# Patient Record
Sex: Male | Born: 1980 | Race: Black or African American | Hispanic: No | Marital: Single | Smoking: Never smoker
Health system: Southern US, Community
[De-identification: ages and names within clinical notes are randomized; demographics above are authoritative.]

## PROBLEM LIST (undated history)

## (undated) DIAGNOSIS — J45909 Unspecified asthma, uncomplicated: Secondary | ICD-10-CM

## (undated) HISTORY — PX: MANDIBLE SURGERY: SHX707

---

## 2001-03-18 ENCOUNTER — Emergency Department (HOSPITAL_COMMUNITY): Admission: EM | Admit: 2001-03-18 | Discharge: 2001-03-18 | Payer: Self-pay | Admitting: *Deleted

## 2006-03-08 ENCOUNTER — Inpatient Hospital Stay (HOSPITAL_COMMUNITY): Admission: AD | Admit: 2006-03-08 | Discharge: 2006-03-12 | Payer: Self-pay | Admitting: Internal Medicine

## 2007-02-01 ENCOUNTER — Emergency Department (HOSPITAL_COMMUNITY): Admission: EM | Admit: 2007-02-01 | Discharge: 2007-02-02 | Payer: Self-pay | Admitting: Emergency Medicine

## 2007-09-30 ENCOUNTER — Emergency Department (HOSPITAL_COMMUNITY): Admission: EM | Admit: 2007-09-30 | Discharge: 2007-09-30 | Payer: Self-pay | Admitting: Emergency Medicine

## 2008-08-08 ENCOUNTER — Emergency Department (HOSPITAL_COMMUNITY): Admission: EM | Admit: 2008-08-08 | Discharge: 2008-08-09 | Payer: Self-pay | Admitting: Emergency Medicine

## 2008-08-13 ENCOUNTER — Emergency Department (HOSPITAL_COMMUNITY): Admission: EM | Admit: 2008-08-13 | Discharge: 2008-08-13 | Payer: Self-pay | Admitting: Emergency Medicine

## 2008-08-29 ENCOUNTER — Emergency Department (HOSPITAL_COMMUNITY): Admission: EM | Admit: 2008-08-29 | Discharge: 2008-08-29 | Payer: Self-pay | Admitting: Emergency Medicine

## 2008-11-29 ENCOUNTER — Emergency Department (HOSPITAL_COMMUNITY): Admission: EM | Admit: 2008-11-29 | Discharge: 2008-11-29 | Payer: Self-pay | Admitting: Emergency Medicine

## 2008-12-04 ENCOUNTER — Emergency Department (HOSPITAL_COMMUNITY): Admission: EM | Admit: 2008-12-04 | Discharge: 2008-12-04 | Payer: Self-pay | Admitting: Emergency Medicine

## 2010-04-29 ENCOUNTER — Inpatient Hospital Stay (INDEPENDENT_AMBULATORY_CARE_PROVIDER_SITE_OTHER)
Admission: RE | Admit: 2010-04-29 | Discharge: 2010-04-29 | Disposition: A | Discharge status: Home / Self Care | Payer: Self-pay | Source: Ambulatory Visit | Attending: Family Medicine | Admitting: Family Medicine

## 2010-04-29 DIAGNOSIS — L03019 Cellulitis of unspecified finger: Secondary | ICD-10-CM

## 2010-05-17 LAB — POCT I-STAT, CHEM 8
Calcium, Ion: 1.02 mmol/L — ABNORMAL LOW (ref 1.12–1.32)
Chloride: 106 mEq/L (ref 96–112)
Glucose, Bld: 133 mg/dL — ABNORMAL HIGH (ref 70–99)
HCT: 51 % (ref 39.0–52.0)
Potassium: 3.4 mEq/L — ABNORMAL LOW (ref 3.5–5.1)
Sodium: 141 mEq/L (ref 135–145)
TCO2: 22 mmol/L (ref 0–100)

## 2010-05-17 LAB — CBC
Hemoglobin: 16 g/dL (ref 13.0–17.0)
RBC: 5.63 MIL/uL (ref 4.22–5.81)
RDW: 13.9 % (ref 11.5–15.5)

## 2010-05-17 LAB — ETHANOL: Alcohol, Ethyl (B): 240 mg/dL — ABNORMAL HIGH (ref 0–10)

## 2010-05-17 LAB — LACTIC ACID, PLASMA: Lactic Acid, Venous: 2.4 mmol/L — ABNORMAL HIGH (ref 0.5–2.2)

## 2010-06-26 NOTE — H&P (Signed)
NAME:  Jared Chung, Jared Chung NO.:  [id number]   MEDICAL RECORD NO.:  [id number]          PATIENT TYPE:  EMS   LOCATION:  ED                           FACILITY:  [facility]   PHYSICIAN:  Hind I Elsaid, MD      DATE OF BIRTH:  04/10/1980   DATE OF ADMISSION:  03/08/2006  DATE OF DISCHARGE:                              HISTORY & PHYSICAL   CHIEF COMPLAINT:  Sore throat for one week and breaking of the skin for  three days.   HISTORY OF PRESENT ILLNESS:  This is a 30 year old African American male  with no previous medical history.  He presented to [facility] Emergency  Room with history starting since this Wednesday when the patient felt  sore throat and generalized body weakness associated with low grade  fever.  The patient had to see urgent care on Saturday for above  symptoms where he was found to have a fever of 101.1 and also according  to him a throat culture was done and he was diagnosed with streptococcal  tonsillitis and was discharged home with Penicillin V orally, Naproxen  and Vicodin.  The patient went home and for a day he felt better, then  he felt his sore throat was not improving and he has started to have  breaking on his mucous membranes, mainly around lips and small ulcer  around the tongue.  He developed epigastric abdominal pain like mainly  heart burn, epigastric in nature, associated with two to three times  spitting of phlegm associated with blood.  The condition associated with  skin rash but there is no breaking of the skin.  Condition associated  with fever and associated with chills and muscular pain and arthralgia.  Denies any chest pain.  Denies any shortness of breath.  Had two  episodes of diarrhea on Sunday, not associated with blood, watery which  was stopped since Sunday.  No further episode of diarrhea.  Denies any  hematuria.  Denies any lower extremity swelling and denies any other  discharge.   PAST MEDICAL HISTORY:  None.   FAMILY  HISTORY:  Positive for asthma in the mother.   ALLERGIES:  Denies any previous history of allergy to medication.   SOCIAL HISTORY:  No history of drug abuse.  No history of smoking.  Occasional drinker, maybe on the weekends, not a heavy drinker.  He  lives with his girl friend and he works in [employer].   PAST SURGICAL HISTORY:  Above surgical history of some mandibular  surgery when he was a child.   VITAL SIGNS:  During examination in the Emergency Room, the patient had  above vital signs.  Temperature 99.4, blood pressure 142/86, pulse rate  98, respiratory rate 20 and saturating 98% on room air.  CONSTITUTIONAL:  The patient is lying down, comfortable, no respiratory  distress or shortness of breath.  No pain.  HEENT:  The eyes, there is no evidence of any mucosal breaking around  the eyes.  No evidence of conjunctivitis.  Pupils are equal, round and  reactive to light and accommodation.  Extraocular movements normal.  Examination of the lips:  There is some breaking of the lips and some  angular stomatitis and there is some tongue ulcer.  There is some  evidence of tonsillar hypertrophy but there is no evidence of mucosal  breaking around the buccal cavity.  NECK:  Supple.  No thyromegaly.  No lymphadenopathy.  LUNGS:  Normal fascicular breathing with equal air entry.  HEART:  S1 and S2.  No murmur or gallop.  ABDOMEN:  Soft, nontender.  Bowel sounds positive.  EXTREMITIES:  There is no lower leg edema.  Peripheral pulses intact.  SKIN:  There is some erythematous rash around the upper forearm and  around the thigh but there is no evidence of skin breaking.  CNS:  The patient is oriented x3 with no focal neurological finding.   ASSESSMENT/PLAN:  He is a 30 year old African American male diagnosed  with streptococcal tonsillitis which was started on Penicillin V, on  Naproxen and Vicodin for relief of pain.  The developed mucosal  ulcerations mainly around the lips and a  skin rash, most probably due to  Penicillin, possible Naproxen allergy or Codeine allergy.   1. Most probably the patient has mucositis, unlikely Steven-Johnson's      as Steven-Johnson syndrome is more widely and generalized skin      eruption.  Would put the patient on clear liquid diet today,      aggressive hydration, Protonix IV, oral pads and mouth wash for the      mouth.  Will boost the patient's Solu-Medrol 60 mg IV b.i.d. and      observe vital signs for any evidence of respiratory distress.   1. History of streptococcal tonsillitis with possible allergy to      Penicillin.  Will get throat culture for streptococcus and      __________ .  As the patient possibly has allergy to Penicillin,      will start the patient on Erythromycin 400 mg p.o. q. 6 hours for      ten days.  DVT and GI prophylaxis.      Hind Bosie Helper, MD  Electronically Signed     HIE/MEDQ  D:  03/08/2006  T:  03/08/2006  Job:  [id number]

## 2010-06-26 NOTE — Discharge Summary (Signed)
NAMEKALIM, KISSEL NO.:  [id number]   MEDICAL RECORD NO.:  [id number]          PATIENT TYPE:  INP   LOCATION:  5702                         FACILITY:  [facility]   PHYSICIAN:  Hettie Holstein, D.O.    DATE OF BIRTH:  January 29, 1981   DATE OF ADMISSION:  03/08/2006  DATE OF DISCHARGE:  03/12/2006                               DISCHARGE SUMMARY   FINAL DIAGNOSES:  1. Mucositis due to a drug reaction, initially felt to be related to      penicillin.  2. Resolving strep throat, to complete treatment course in the      outpatient setting.   MEDICATIONS ON DISCHARGE:  Mr. Iversen was discharged on:  1. Zithromax 250 mg daily x4 days.  2. Percocet 5/325 every 6 hours as needed for pain, dispensed number      15, no driving.  3. Orabase 28% paste to mouth and lips every 6 hours as needed.   DISPOSITION:  He is discharged in improved and in medically stable  condition, to follow up with primary care physician as needed.   HISTORY OF PRESENT ILLNESS:  For further details, please refer to the  H&P.  However, briefly, Mr. Hudnall is a 30 year old African American male  with no previous medical history who presented to [facility] ER with  history stating that he felt generalized body weakness, low-grade fever  and sore throat.  He was seen in urgent care on Saturday for symptoms  and found to have a fever of 101.1.  He had a throat culture done that  revealed strep, tonsillitis and he was discharged home with penicillin  orally, Naproxen and Vicodin.  He went home and for a day he felt better  and then he felt his sore throat was not improving.  He started having  soreness in his mouth and around his lips with a small ulcer around the  tongue.  He developed epigastric abdominal pain and again with copious  phlegm with some blood and he did have a skin rash.  He was seen in the  emergency department by admitting physician and felt to have developed a  drug reaction.  Hospital course  was predominantly of symptom management  and IV hydration.  He was provided IV Benadryl and Solu-Medrol.  Symptoms did begin to resolve and he was felt to be medically stable for  discharge home with continued symptomatic management.  He is instructed  to avoid penicillin.      Hettie Holstein, D.O.  Electronically Signed     ESS/MEDQ  D:  07/07/2006  T:  07/07/2006  Job:  [id number]

## 2010-11-13 LAB — CBC
HCT: 42.6
Hemoglobin: 14
MCHC: 32.9
MCV: 80.3
Platelets: 192
RBC: 5.3
RDW: 13.5
WBC: 9.2

## 2010-11-13 LAB — COMPREHENSIVE METABOLIC PANEL
ALT: 149 — ABNORMAL HIGH
AST: 80 — ABNORMAL HIGH
Alkaline Phosphatase: 79
BUN: 9
Chloride: 102
GFR calc non Af Amer: >60
Glucose, Bld: 108 — ABNORMAL HIGH
Total Bilirubin: 0.7
Total Protein: 6.5

## 2010-11-13 LAB — DIFFERENTIAL
Basophils Absolute: 0
Basophils Relative: 0
Eosinophils Absolute: 0
Eosinophils Relative: 1
Lymphocytes Relative: 11 — ABNORMAL LOW
Lymphs Abs: 1
Monocytes Absolute: 0.8
Monocytes Relative: 9
Neutro Abs: 7.4
Neutrophils Relative %: 79 — ABNORMAL HIGH

## 2010-11-13 LAB — URINE CULTURE
Colony Count: NO GROWTH
Culture: NO GROWTH

## 2010-11-13 LAB — COMPREHENSIVE METABOLIC PANEL WITH GFR
Albumin: 3.7
CO2: 26
Calcium: 8.9
Creatinine, Ser: 0.98
GFR calc Af Amer: >60
Potassium: 3.7
Sodium: 135

## 2010-11-13 LAB — URINE MICROSCOPIC-ADD ON

## 2010-11-13 LAB — URINALYSIS, ROUTINE W REFLEX MICROSCOPIC
Leukocytes, UA: NEGATIVE
Nitrite: NEGATIVE
Urobilinogen, UA: 0.2

## 2010-11-13 LAB — SAMPLE TO BLOOD BANK

## 2012-02-25 ENCOUNTER — Encounter (HOSPITAL_COMMUNITY): Payer: Self-pay | Admitting: *Deleted

## 2012-02-25 ENCOUNTER — Emergency Department (HOSPITAL_COMMUNITY)
Admission: EM | Admit: 2012-02-25 | Discharge: 2012-02-25 | Disposition: A | Discharge status: Home / Self Care | Payer: 59 | Source: Home / Self Care | Attending: Family Medicine | Admitting: Family Medicine

## 2012-02-25 DIAGNOSIS — K529 Noninfective gastroenteritis and colitis, unspecified: Secondary | ICD-10-CM

## 2012-02-25 DIAGNOSIS — K5289 Other specified noninfective gastroenteritis and colitis: Secondary | ICD-10-CM

## 2012-02-25 HISTORY — DX: Unspecified asthma, uncomplicated: J45.909

## 2012-02-25 MED ORDER — ONDANSETRON HCL 4 MG PO TABS: 4.0000 mg | ORAL_TABLET | Freq: Four times a day (QID) | ORAL | Status: DC

## 2012-02-25 MED ORDER — ONDANSETRON 4 MG PO TBDP
4.0000 mg | ORAL_TABLET | Freq: Once | ORAL | Status: AC
  Administered 2012-02-25: 4 mg via ORAL

## 2012-02-25 MED ORDER — ONDANSETRON 4 MG PO TBDP
ORAL_TABLET | ORAL | Status: AC
  Filled 2012-02-25: qty 1

## 2012-02-25 NOTE — ED Provider Notes (Signed)
History     CSN: [id number]  Arrival date & time 02/25/12  1608   First MD Initiated Contact with Patient 02/25/12 1615      Chief Complaint  Patient presents with  . Emesis    (Consider location/radiation/quality/duration/timing/severity/associated sxs/prior treatment) Patient is a 32 y.o. male presenting with vomiting. The history is provided by the patient.  Emesis  This is a new problem. The current episode started yesterday. The problem occurs 2 to 4 times per day. The problem has been gradually improving. There has been no fever. Associated symptoms include cough and diarrhea. Pertinent negatives include no chills.    Past Medical History  Diagnosis Date  . Asthma     Past Surgical History  Procedure Date  . Mandible surgery     History reviewed. No pertinent family history.  History  Substance Use Topics  . Smoking status: Never Smoker   . Smokeless tobacco: Not on file  . Alcohol Use: Yes     Comment: occasionally      Review of Systems  Constitutional: Positive for appetite change. Negative for chills.  HENT: Negative.   Respiratory: Positive for cough.   Cardiovascular: Negative.   Gastrointestinal: Positive for vomiting and diarrhea.  Genitourinary: Negative.     Allergies  Penicillins  Home Medications   Current Outpatient Rx  Name  Route  Sig  Dispense  Refill  . ONDANSETRON HCL 4 MG PO TABS   Oral   Take 1 tablet (4 mg total) by mouth every 6 (six) hours. As needed for n/v   6 tablet   0     BP 157/105  Pulse 74  Temp 98.4 F (36.9 C) (Oral)  Resp 20  SpO2 98%  Physical Exam  Nursing note and vitals reviewed. Constitutional: He is oriented to person, place, and time. He appears well-developed and well-nourished.  HENT:  Head: Normocephalic.  Right Ear: External ear normal.  Left Ear: External ear normal.  Mouth/Throat: Oropharynx is clear and moist.  Eyes: Conjunctivae normal are normal. Pupils are equal, round, and  reactive to light.  Neck: Normal range of motion. Neck supple.  Cardiovascular: Normal rate, regular rhythm, normal heart sounds and intact distal pulses.   Pulmonary/Chest: Breath sounds normal.  Abdominal: Soft. Bowel sounds are normal. There is no tenderness.  Lymphadenopathy:    He has no cervical adenopathy.  Neurological: He is alert and oriented to person, place, and time.  Skin: Skin is warm and dry.    ED Course  Procedures (including critical care time)  Labs Reviewed - No data to display No results found.   1. Gastroenteritis, acute       MDM          Linna Hoff, MD 02/25/12 1750

## 2012-02-25 NOTE — ED Notes (Signed)
Onset yesterday N, V and D.  Today he has not had any vomiting, but had 2 loose stools.  He denies fever, but felt chills this morning along with a sore throat and a nonproductive cough.

## 2012-12-20 ENCOUNTER — Emergency Department (HOSPITAL_COMMUNITY): Payer: 59

## 2012-12-20 ENCOUNTER — Encounter (HOSPITAL_COMMUNITY): Payer: Self-pay | Admitting: Emergency Medicine

## 2012-12-20 ENCOUNTER — Emergency Department (HOSPITAL_COMMUNITY)
Admission: EM | Admit: 2012-12-20 | Discharge: 2012-12-20 | Disposition: A | Discharge status: Home / Self Care | Payer: 59 | Attending: Emergency Medicine | Admitting: Emergency Medicine

## 2012-12-20 DIAGNOSIS — J45901 Unspecified asthma with (acute) exacerbation: Secondary | ICD-10-CM | POA: Insufficient documentation

## 2012-12-20 DIAGNOSIS — R079 Chest pain, unspecified: Secondary | ICD-10-CM | POA: Insufficient documentation

## 2012-12-20 DIAGNOSIS — Z880 Allergy status to penicillin: Secondary | ICD-10-CM | POA: Insufficient documentation

## 2012-12-20 DIAGNOSIS — Z79899 Other long term (current) drug therapy: Secondary | ICD-10-CM | POA: Insufficient documentation

## 2012-12-20 DIAGNOSIS — J45909 Unspecified asthma, uncomplicated: Secondary | ICD-10-CM

## 2012-12-20 DIAGNOSIS — K219 Gastro-esophageal reflux disease without esophagitis: Secondary | ICD-10-CM

## 2012-12-20 DIAGNOSIS — R0600 Dyspnea, unspecified: Secondary | ICD-10-CM

## 2012-12-20 LAB — CBC
HCT: 46.8 % (ref 39.0–52.0)
Platelets: 182 10*3/uL (ref 150–400)
RBC: 5.9 MIL/uL — ABNORMAL HIGH (ref 4.22–5.81)
RDW: 14 % (ref 11.5–15.5)
WBC: 5.5 10*3/uL (ref 4.0–10.5)

## 2012-12-20 LAB — POCT I-STAT TROPONIN I: Troponin i, poc: 0 ng/mL (ref 0.00–0.08)

## 2012-12-20 LAB — BASIC METABOLIC PANEL
Calcium: 9.5 mg/dL (ref 8.4–10.5)
Chloride: 103 mEq/L (ref 96–112)
Creatinine, Ser: 0.77 mg/dL (ref 0.50–1.35)
GFR calc Af Amer: >90 mL/min (ref >90)
Sodium: 138 mEq/L (ref 135–145)

## 2012-12-20 LAB — D-DIMER, QUANTITATIVE: D-Dimer, Quant: 0.38 ug/mL-FEU (ref 0.00–0.48)

## 2012-12-20 MED ORDER — OMEPRAZOLE 20 MG PO CPDR: 20.0000 mg | DELAYED_RELEASE_CAPSULE | Freq: Two times a day (BID) | ORAL | Status: DC

## 2012-12-20 MED ORDER — ALBUTEROL SULFATE HFA 108 (90 BASE) MCG/ACT IN AERS: 1.0000 | INHALATION_SPRAY | Freq: Four times a day (QID) | RESPIRATORY_TRACT | Status: DC | PRN

## 2012-12-20 NOTE — ED Notes (Signed)
Pt escorted to discharge window. Pt verbalized understanding discharge instructions. In no acute distress.  

## 2012-12-20 NOTE — ED Provider Notes (Signed)
CSN: [id number]     Arrival date & time 12/20/12  3086 History   First MD Initiated Contact with Patient 12/20/12 [phone]     Chief Complaint  Patient presents with  . Chest Pain  . Shortness of Breath    HPI   Patient presents with complaint of shortness of breath and left-sided chest pain. The said pain has been intermittent and going on for longer than a year. It is a burning feeling and occasionally sharp and always located in his left anterior chest. Does not radiate. Has not changed. Comparison is here is that he is some shortness of breath for the last week. He works primarily doing Psychiatrist . Patient is short of breath at rest states that he gets off his loader and walks with a short distance feel short of breath. He is uncertain if he is wheezing. Used to have asthma as a kid. Use inhaler 4-5 years. Has not been wheezing. Not been febrile. He is not does not feel sick with GI symptoms body aches or other complaints. Has a grandmother and paternal grandfather with history of DVTs. He's never had a DVT. No prolonged immobilization. NO recent procedures. No history of malignancies. Past Medical History  Diagnosis Date  . Asthma    Past Surgical History  Procedure Laterality Date  . Mandible surgery     History reviewed. No pertinent family history. History  Substance Use Topics  . Smoking status: Never Smoker   . Smokeless tobacco: Not on file  . Alcohol Use: Yes     Comment: occasionally    Review of Systems  Constitutional: Negative for fever, chills, diaphoresis, appetite change and fatigue.  HENT: Negative for mouth sores, sore throat and trouble swallowing.   Eyes: Negative for visual disturbance.  Respiratory: Positive for shortness of breath. Negative for cough, chest tightness and wheezing.   Cardiovascular: Positive for chest pain.  Gastrointestinal: Negative for nausea, vomiting, abdominal pain, diarrhea and abdominal distention.  Endocrine: Negative for  polydipsia, polyphagia and polyuria.  Genitourinary: Negative for dysuria, frequency and hematuria.  Musculoskeletal: Negative for gait problem.  Skin: Negative for color change, pallor and rash.  Neurological: Negative for dizziness, syncope, light-headedness and headaches.  Hematological: Does not bruise/bleed easily.  Psychiatric/Behavioral: Negative for behavioral problems and confusion.    Allergies  Penicillins  Home Medications   Current Outpatient Rx  Name  Route  Sig  Dispense  Refill  . ranitidine (ZANTAC) 150 MG tablet   Oral   Take 150 mg by mouth daily as needed for heartburn.         Marland Kitchen albuterol (PROVENTIL HFA;VENTOLIN HFA) 108 (90 BASE) MCG/ACT inhaler   Inhalation   Inhale 1-2 puffs into the lungs every 6 (six) hours as needed for wheezing.   1 Inhaler   5   . omeprazole (PRILOSEC) 20 MG capsule   Oral   Take 1 capsule (20 mg total) by mouth 2 (two) times daily.   60 capsule   0    BP 131/73  Pulse 75  Temp(Src) 98.6 F (37 C) (Oral)  Resp 16  Ht 5\' 9"  (1.753 m)  Wt 240 lb (108.863 kg)  BMI 35.43 kg/m2  SpO2 100% Physical Exam  Constitutional: He is oriented to person, place, and time. He appears well-developed and well-nourished. No distress.  Is not dyspneic at rest with conversation.  HENT:  Head: Normocephalic.  Eyes: Conjunctivae are normal. Pupils are equal, round, and reactive to light. No scleral  icterus.  Neck: Normal range of motion. Neck supple. No thyromegaly present.  Cardiovascular: Normal rate, regular rhythm and normal heart sounds.  Exam reveals no gallop and no friction rub.   No murmur heard. Pulmonary/Chest: Effort normal and breath sounds normal. No respiratory distress. He has no decreased breath sounds. He has no wheezes. He has no rhonchi. He has no rales.  Abdominal: Soft. Bowel sounds are normal. He exhibits no distension. There is no tenderness. There is no rebound.  Musculoskeletal: Normal range of motion.   Neurological: He is alert and oriented to person, place, and time.  Skin: Skin is warm and dry. No rash noted.  No edema or cording.  No asymmetry to leg circumference.  Psychiatric: He has a normal mood and affect. His behavior is normal.    ED Course  Procedures (including critical care time) Labs Review Labs Reviewed  CBC - Abnormal; Notable for the following:    RBC 5.90 (*)    All other components within normal limits  BASIC METABOLIC PANEL - Abnormal; Notable for the following:    Glucose, Bld 106 (*)    All other components within normal limits  PRO B NATRIURETIC PEPTIDE  D-DIMER, QUANTITATIVE  POCT I-STAT TROPONIN I   Imaging Review No results found.  EKG Interpretation     Ventricular Rate:  92 PR Interval:  155 QRS Duration: 79 QT Interval:  345 QTC Calculation: 427 R Axis:   68 Text Interpretation:  Sinus rhythm            MDM   1. Dyspnea   2. GERD (gastroesophageal reflux disease)   3. Asthma    Cardiac evaluation normal. Normal chest x-ray. BNP not elevated. Normal d-dimer. Plan will be treatment for asthma. Pressure given for the presumed reflux    Roney Marion, MD 12/22/12 [phone]

## 2012-12-20 NOTE — ED Notes (Addendum)
Pt from home.  Pt states chest pain for a year.  States tonight numbness to left arm and shortness of breath.  No visual neuro deficits noted. Nasal congestion also noted per patient.  No fever.  No cough.  Sats 99% ra on arrival.

## 2012-12-20 NOTE — ED Notes (Signed)
Pt alert and oriented x4. Respirations even and unlabored, bilateral symmetrical rise and fall of chest. Skin warm and dry. In no acute distress. Denies needs.   

## 2013-01-03 ENCOUNTER — Inpatient Hospital Stay: Payer: 59 | Admitting: Internal Medicine

## 2013-09-01 ENCOUNTER — Encounter (HOSPITAL_COMMUNITY): Payer: Self-pay | Admitting: Emergency Medicine

## 2013-09-01 ENCOUNTER — Emergency Department (INDEPENDENT_AMBULATORY_CARE_PROVIDER_SITE_OTHER)
Admission: EM | Admit: 2013-09-01 | Discharge: 2013-09-01 | Disposition: A | Discharge status: Home / Self Care | Payer: 59 | Source: Home / Self Care

## 2013-09-01 DIAGNOSIS — H109 Unspecified conjunctivitis: Secondary | ICD-10-CM

## 2013-09-01 MED ORDER — POLYMYXIN B-TRIMETHOPRIM 10000-0.1 UNIT/ML-% OP SOLN: 1.0000 [drp] | OPHTHALMIC | Status: DC

## 2013-09-01 NOTE — ED Provider Notes (Signed)
Medical screening examination/treatment/procedure(s) were performed by resident physician or non-physician practitioner and as supervising physician I was immediately available for consultation/collaboration.   KINDL,JAMES DOUGLAS MD.   James D Kindl, MD 09/01/13 1106 

## 2013-09-01 NOTE — ED Provider Notes (Signed)
CSN: [id number]     Arrival date & time 09/01/13  0901 History   First MD Initiated Contact with Patient 09/01/13 [phone]     Chief Complaint  Patient presents with  . Conjunctivitis   (Consider location/radiation/quality/duration/timing/severity/associated sxs/prior Treatment) HPI Comments: 33 year old male states yesterday he developed discomfort to the right. This was followed by a clear mucoid drainage. He was this morning with his eyelids matted shut. He is complaining of redness of the eye, mild burning and drainage. Sees a male that he has but other than that visual acuity remains unchanged.   Past Medical History  Diagnosis Date  . Asthma    Past Surgical History  Procedure Laterality Date  . Mandible surgery     No family history on file. History  Substance Use Topics  . Smoking status: Never Smoker   . Smokeless tobacco: Not on file  . Alcohol Use: Yes     Comment: occasionally    Review of Systems  Constitutional: Negative.   HENT: Negative.   Eyes: Positive for pain, discharge and redness. Negative for visual disturbance.  All other systems reviewed and are negative.   Allergies  Penicillins  Home Medications   Prior to Admission medications   Medication Sig Start Date End Date Taking? Authorizing Provider  albuterol (PROVENTIL HFA;VENTOLIN HFA) 108 (90 BASE) MCG/ACT inhaler Inhale 1-2 puffs into the lungs every 6 (six) hours as needed for wheezing. 12/20/12   Rolland PorterMark James, MD  omeprazole (PRILOSEC) 20 MG capsule Take 1 capsule (20 mg total) by mouth 2 (two) times daily. 12/20/12   Rolland PorterMark James, MD  ranitidine (ZANTAC) 150 MG tablet Take 150 mg by mouth daily as needed for heartburn.    Historical Provider, MD  trimethoprim-polymyxin b (POLYTRIM) ophthalmic solution Place 1 drop into the right eye every 4 (four) hours. 09/01/13   Hayden Rasmussenavid Doreather Hoxworth, NP   BP 146/97  Pulse 95  Temp(Src) 98.4 F (36.9 C) (Oral)  Resp 18  SpO2 98% Physical Exam  Nursing note and vitals  reviewed. Constitutional: He is oriented to person, place, and time. He appears well-developed and well-nourished. No distress.  Eyes: EOM are normal. Pupils are equal, round, and reactive to light. Right eye exhibits discharge. Left eye exhibits no discharge.  Right upper and lower lid conjunctival swelling and erythema. Sclera mildly injected. Clear watery discharge. No FB seen, No corneal defect seen.   Neck: Normal range of motion. Neck supple.  Pulmonary/Chest: Effort normal. No respiratory distress.  Neurological: He is alert and oriented to person, place, and time. He exhibits normal muscle tone.  Skin: Skin is warm and dry.    ED Course  Procedures (including critical care time) Labs Review Labs Reviewed - No data to display  Imaging Review No results found.   MDM   1. Conjunctivitis, right eye    polytrim eye drops zaditor bid    Hayden Rasmussenavid Avigayil Ton, NP 09/01/13 [phone]

## 2013-09-01 NOTE — Discharge Instructions (Signed)
Conjunctivitis °Conjunctivitis is commonly called "pink eye." Conjunctivitis can be caused by bacterial or viral infection, allergies, or injuries. There is usually redness of the lining of the eye, itching, discomfort, and sometimes discharge. There may be deposits of matter along the eyelids. A viral infection usually causes a watery discharge, while a bacterial infection causes a yellowish, thick discharge. Pink eye is very contagious and spreads by direct contact. °You may be given antibiotic eyedrops as part of your treatment. Before using your eye medicine, remove all drainage from the eye by washing gently with warm water and cotton balls. Continue to use the medication until you have awakened 2 mornings in a row without discharge from the eye. Do not rub your eye. This increases the irritation and helps spread infection. Use separate towels from other household members. Wash your hands with soap and water before and after touching your eyes. Use cold compresses to reduce pain and sunglasses to relieve irritation from light. Do not wear contact lenses or wear eye makeup until the infection is gone. °SEEK MEDICAL CARE IF:  °· Your symptoms are not better after 3 days of treatment. °· You have increased pain or trouble seeing. °· The outer eyelids become very red or swollen. °Document Released: 03/04/2004 Document Revised: 04/19/2011 Document Reviewed: 01/25/2005 °ExitCare® Patient Information ©2015 [employer]. This information is not intended to replace advice given to you by your health care provider. Make sure you discuss any questions you have with your health care provider. ° °Eye Drops °Use eye drops as directed. It may be easier to have someone help you put the drops in your eye. If you are alone, use the following instructions to help you. °· Wash your hands before putting drops in your eyes. °· Read the label and look at your medication. Check for any expiration date that may appear on the bottle or  tube. Changes of color may be a warning that the medication is old or ineffective. This is especially true if the medication has become brown in color. If you have questions or concerns, call your caregiver. °DROPS °· Tilt your head back with the affected eye uppermost. Gently pull down on your lower lid. Do not pull up on the upper lid. °· Look up. Place the dropper or bottle just over the edge of the lower lid near the white portion at the bottom of the eye. The goal is to have the drop go into the little sac formed by the lower lid and the bottom of the eye itself. Do not release the drop from a height of several inches over the eye. That will only serve to startle the person receiving the medicine when it lands and forces a blink. °· Steady your hand in a comfortable manner. An example would be to hold the dropper or bottle between your thumb and index (pointing) finger. Lean your index finger against the brow. °· Then, slowly and gently squeeze one drop of medication into your eye. °· Once the medication has been applied, place your finger between the lower eyelid and the nose, pressing firmly against the nose for 5-10 seconds. This will slow the process of the eye drop entering the small canal that normally drains tears into the nose, and therefore increases the exposure of the medicine to the eye for a few extra seconds. °OINTMENTS °· Look up. Place the tip of the tube just over the edge of the lower lid near the white portion at the bottom of the   eye. The goal is to create a line of ointment along the inner surface of the eyelid in the little sac formed by the lower lid and the bottom of the eye itself. °· Avoid touching the tube tip to your eyeball or eyelid. This avoids contamination of the tube or the medicine in the tube. °· Once a line of medicine has been created, hold the upper lid up and look down before releasing the upper lid. This will force the ointment to spread over the surface of the  eye. °· Your vision will be very blurry for a few minutes after applying an ointment properly. This is normal and will clear as you continue to blink. For this reason, it is best to apply ointments just before going to sleep, or at a time when you can rest your eyes for 5-10 minutes after applying the medication. °GENERAL °· Store your medicine in a cool, dry place after each use. °· If you need a second medication, wait at least two minutes. This helps the first medication to be taken up (absorbed) by the eye. °· If you have been instructed to use both an eye drop and an eye ointment, always apply the drop first and then the ointment 3-4 minutes afterward. °Never put medications into the eye unless the label reads, "For Ophthalmic Use," "For Use In Eyes" or "Eye Drops." If you have questions, call your caregiver. °Document Released: 05/03/2000 Document Revised: 06/11/2013 Document Reviewed: 07/09/2008 °ExitCare® Patient Information ©2015 [employer]. This information is not intended to replace advice given to you by your health care provider. Make sure you discuss any questions you have with your health care provider. ° °

## 2013-09-01 NOTE — ED Notes (Signed)
Pt c/o right eye irritation, redness onset yest Denies f/v/n/d Alert w/no signs of acute distress.

## 2014-02-27 ENCOUNTER — Emergency Department (HOSPITAL_COMMUNITY)
Admission: EM | Admit: 2014-02-27 | Discharge: 2014-02-27 | Discharge status: Against Medical Advice | Payer: Self-pay | Attending: Emergency Medicine | Admitting: Emergency Medicine

## 2014-02-27 DIAGNOSIS — R110 Nausea: Secondary | ICD-10-CM | POA: Insufficient documentation

## 2014-02-27 DIAGNOSIS — R1084 Generalized abdominal pain: Secondary | ICD-10-CM | POA: Insufficient documentation

## 2014-02-27 DIAGNOSIS — J45909 Unspecified asthma, uncomplicated: Secondary | ICD-10-CM | POA: Insufficient documentation

## 2014-02-27 NOTE — ED Provider Notes (Signed)
Pt was not seen by myself prior to leaving the ED.   Lyanne CoKevin M Jamilyn Pigeon, MD 02/27/14 [phone]

## 2014-02-27 NOTE — ED Notes (Signed)
Pt reports nausea started yesterday, pt reports generalized abd pain since yesterday. Had bowel movement this am and now feels a little better.

## 2014-02-27 NOTE — ED Notes (Signed)
Pt reports that he meant to go to [facility], wants to leave pre triage.

## 2014-11-16 DIAGNOSIS — K429 Umbilical hernia without obstruction or gangrene: Secondary | ICD-10-CM | POA: Insufficient documentation

## 2014-11-16 DIAGNOSIS — Z6839 Body mass index (BMI) 39.0-39.9, adult: Secondary | ICD-10-CM | POA: Insufficient documentation

## 2015-06-09 ENCOUNTER — Emergency Department (HOSPITAL_COMMUNITY): Payer: 59

## 2015-06-09 ENCOUNTER — Encounter (HOSPITAL_COMMUNITY): Payer: Self-pay

## 2015-06-09 ENCOUNTER — Emergency Department (HOSPITAL_COMMUNITY)
Admission: EM | Admit: 2015-06-09 | Discharge: 2015-06-09 | Disposition: A | Discharge status: Home / Self Care | Payer: 59 | Attending: Emergency Medicine | Admitting: Emergency Medicine

## 2015-06-09 DIAGNOSIS — S161XXA Strain of muscle, fascia and tendon at neck level, initial encounter: Secondary | ICD-10-CM | POA: Insufficient documentation

## 2015-06-09 DIAGNOSIS — Y939 Activity, unspecified: Secondary | ICD-10-CM | POA: Diagnosis not present

## 2015-06-09 DIAGNOSIS — Z7951 Long term (current) use of inhaled steroids: Secondary | ICD-10-CM | POA: Insufficient documentation

## 2015-06-09 DIAGNOSIS — M542 Cervicalgia: Secondary | ICD-10-CM | POA: Diagnosis present

## 2015-06-09 DIAGNOSIS — S2231XA Fracture of one rib, right side, initial encounter for closed fracture: Secondary | ICD-10-CM | POA: Diagnosis not present

## 2015-06-09 DIAGNOSIS — R109 Unspecified abdominal pain: Secondary | ICD-10-CM | POA: Diagnosis not present

## 2015-06-09 DIAGNOSIS — J45909 Unspecified asthma, uncomplicated: Secondary | ICD-10-CM | POA: Diagnosis not present

## 2015-06-09 DIAGNOSIS — Y999 Unspecified external cause status: Secondary | ICD-10-CM | POA: Diagnosis not present

## 2015-06-09 DIAGNOSIS — Y92410 Unspecified street and highway as the place of occurrence of the external cause: Secondary | ICD-10-CM | POA: Diagnosis not present

## 2015-06-09 DIAGNOSIS — Z79899 Other long term (current) drug therapy: Secondary | ICD-10-CM | POA: Diagnosis not present

## 2015-06-09 DIAGNOSIS — S1091XA Abrasion of unspecified part of neck, initial encounter: Secondary | ICD-10-CM

## 2015-06-09 MED ORDER — IBUPROFEN 200 MG PO TABS
400.0000 mg | ORAL_TABLET | Freq: Once | ORAL | Status: AC
  Administered 2015-06-09: 400 mg via ORAL
  Filled 2015-06-09: qty 2

## 2015-06-09 MED ORDER — HYDROCODONE-ACETAMINOPHEN 5-325 MG PO TABS: 1.0000 | ORAL_TABLET | Freq: Four times a day (QID) | ORAL | Status: DC | PRN

## 2015-06-09 MED ORDER — TETANUS-DIPHTH-ACELL PERTUSSIS 5-2.5-18.5 LF-MCG/0.5 IM SUSP
0.5000 mL | Freq: Once | INTRAMUSCULAR | Status: DC
  Filled 2015-06-09: qty 0.5

## 2015-06-09 MED ORDER — TRAMADOL HCL 50 MG PO TABS
50.0000 mg | ORAL_TABLET | Freq: Once | ORAL | Status: AC
  Administered 2015-06-09: 50 mg via ORAL
  Filled 2015-06-09: qty 1
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
## 2016-12-22 DIAGNOSIS — G4710 Hypersomnia, unspecified: Secondary | ICD-10-CM | POA: Insufficient documentation
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
## 2018-07-24 DIAGNOSIS — R10814 Left lower quadrant abdominal tenderness: Secondary | ICD-10-CM | POA: Insufficient documentation

## 2018-07-24 DIAGNOSIS — R1032 Left lower quadrant pain: Secondary | ICD-10-CM | POA: Insufficient documentation

## 2019-03-17 DIAGNOSIS — Z20822 Contact with and (suspected) exposure to covid-19: Secondary | ICD-10-CM | POA: Insufficient documentation

## 2019-09-01 DIAGNOSIS — H1132 Conjunctival hemorrhage, left eye: Secondary | ICD-10-CM | POA: Insufficient documentation

## 2019-09-08 DIAGNOSIS — Z13228 Encounter for screening for other metabolic disorders: Secondary | ICD-10-CM | POA: Insufficient documentation

## 2019-09-15 DIAGNOSIS — Z0000 Encounter for general adult medical examination without abnormal findings: Secondary | ICD-10-CM | POA: Insufficient documentation

## 2019-09-15 DIAGNOSIS — E559 Vitamin D deficiency, unspecified: Secondary | ICD-10-CM | POA: Insufficient documentation

## 2019-09-15 DIAGNOSIS — E785 Hyperlipidemia, unspecified: Secondary | ICD-10-CM | POA: Insufficient documentation

## 2019-12-29 ENCOUNTER — Other Ambulatory Visit: Payer: Self-pay

## 2019-12-29 DIAGNOSIS — Z20822 Contact with and (suspected) exposure to covid-19: Secondary | ICD-10-CM

## 2019-12-30 LAB — NOVEL CORONAVIRUS, NAA: SARS-CoV-2, NAA: NOT DETECTED

## 2019-12-30 LAB — SARS-COV-2, NAA 2 DAY TAT

## 2021-01-11 DIAGNOSIS — G4733 Obstructive sleep apnea (adult) (pediatric): Secondary | ICD-10-CM | POA: Insufficient documentation

## 2021-01-11 DIAGNOSIS — Z79899 Other long term (current) drug therapy: Secondary | ICD-10-CM | POA: Insufficient documentation

## 2021-01-11 DIAGNOSIS — R030 Elevated blood-pressure reading, without diagnosis of hypertension: Secondary | ICD-10-CM | POA: Insufficient documentation

## 2021-01-11 DIAGNOSIS — S66912A Strain of unspecified muscle, fascia and tendon at wrist and hand level, left hand, initial encounter: Secondary | ICD-10-CM | POA: Insufficient documentation

## 2021-02-10 DIAGNOSIS — J45909 Unspecified asthma, uncomplicated: Secondary | ICD-10-CM | POA: Insufficient documentation

## 2021-02-10 DIAGNOSIS — K219 Gastro-esophageal reflux disease without esophagitis: Secondary | ICD-10-CM | POA: Insufficient documentation

## 2021-07-09 ENCOUNTER — Ambulatory Visit (INDEPENDENT_AMBULATORY_CARE_PROVIDER_SITE_OTHER): Payer: 59 | Admitting: Family Medicine

## 2021-07-09 ENCOUNTER — Encounter: Payer: Self-pay | Admitting: Family Medicine

## 2021-07-09 VITALS — BP 133/92 | HR 98 | Temp 98.1°F | Resp 16 | Ht 69.0 in | Wt 265.0 lb

## 2021-07-09 DIAGNOSIS — K420 Umbilical hernia with obstruction, without gangrene: Secondary | ICD-10-CM

## 2021-07-09 DIAGNOSIS — R0683 Snoring: Secondary | ICD-10-CM | POA: Diagnosis not present

## 2021-07-09 DIAGNOSIS — Z7689 Persons encountering health services in other specified circumstances: Secondary | ICD-10-CM

## 2021-07-09 DIAGNOSIS — K219 Gastro-esophageal reflux disease without esophagitis: Secondary | ICD-10-CM | POA: Diagnosis not present

## 2021-07-09 DIAGNOSIS — E6609 Other obesity due to excess calories: Secondary | ICD-10-CM

## 2021-07-09 DIAGNOSIS — Z6839 Body mass index (BMI) 39.0-39.9, adult: Secondary | ICD-10-CM

## 2021-07-09 MED ORDER — OMEPRAZOLE 20 MG PO CPDR: 20.0000 mg | DELAYED_RELEASE_CAPSULE | Freq: Every day | ORAL | 3 refills | Status: AC

## 2021-07-09 NOTE — Progress Notes (Signed)
Patient is here to establish care with PCP.  Patient is concern about needing to see someone for a moving hernia that he feels  Patient request a CPAP machine for his snoring. Per patient , he had a sleep study 02/2021

## 2021-07-09 NOTE — Progress Notes (Signed)
New Patient Office Visit  Subjective    Patient ID: Jared Chung, male    DOB: [birth year]  Age: 41 y.o. MRN: [id number]  CC:  Chief Complaint  Patient presents with   Establish Care    HPI Jared Chung presents to establish care and for complaint of hernia that is now starting to hurt over the past coupe of weeks. Patient also reports that he has had a recent home sleep study that may have indicated that he needed a cpcp.  Patient denies other complaints.    Outpatient Encounter Medications as of 07/09/2021  Medication Sig   albuterol (PROVENTIL HFA;VENTOLIN HFA) 108 (90 BASE) MCG/ACT inhaler Inhale 1-2 puffs into the lungs every 6 (six) hours as needed for wheezing.   fluticasone (FLONASE) 50 MCG/ACT nasal spray fluticasone propionate 50 mcg/actuation nasal spray,suspension  Spray 1 spray every day by intranasal route for 90 days.   loratadine (CLARITIN) 10 MG tablet loratadine 10 mg tablet  TAKE 1 TABLET BY MOUTH EVERY DAY   montelukast (SINGULAIR) 10 MG tablet montelukast 10 mg tablet  TAKE 1 TABLET BY MOUTH EVERY DAY FOR ASTHMA ALLERGY CONTROL   [DISCONTINUED] famotidine (PEPCID AC) 10 MG chewable tablet Chew 10 mg by mouth 2 (two) times daily as needed for heartburn.   [DISCONTINUED] HYDROcodone-acetaminophen (NORCO/VICODIN) 5-325 MG tablet Take 1-2 tablets by mouth every 6 (six) hours as needed for moderate pain.   [DISCONTINUED] naproxen sodium (ANAPROX) 220 MG tablet Take 440 mg by mouth daily as needed (headaches).   No facility-administered encounter medications on file as of 07/09/2021.    Past Medical History:  Diagnosis Date   Asthma     Past Surgical History:  Procedure Laterality Date   MANDIBLE SURGERY      No family history on file.  Social History   Socioeconomic History   Marital status: Single    Spouse name: Not on file   Number of children: Not on file   Years of education: Not on file   Highest education level: Not on file  Occupational  History   Not on file  Tobacco Use   Smoking status: Never   Smokeless tobacco: Never  Substance and Sexual Activity   Alcohol use: Yes    Comment: occasionally   Drug use: No   Sexual activity: Not on file  Other Topics Concern   Not on file  Social History Narrative   Not on file   Social Determinants of Health   Financial Resource Strain: Not on file  Food Insecurity: Not on file  Transportation Needs: Not on file  Physical Activity: Not on file  Stress: Not on file  Social Connections: Not on file  Intimate Partner Violence: Not on file    Review of Systems  Gastrointestinal:  Positive for abdominal pain. Negative for constipation, diarrhea, nausea and vomiting.  All other systems reviewed and are negative.      Objective    BP (!) 133/92   Pulse 98   Temp 98.1 F (36.7 C) (Oral)   Resp 16   Ht 5\' 9"  (1.753 m)   Wt 265 lb (120.2 kg)   SpO2 95%   BMI 39.13 kg/m   Physical Exam Vitals and nursing note reviewed.  Constitutional:      General: He is not in acute distress.    Appearance: He is obese.  Cardiovascular:     Rate and Rhythm: Normal rate and regular rhythm.  Pulmonary:  Effort: Pulmonary effort is normal.     Breath sounds: Normal breath sounds.  Abdominal:     Palpations: Abdomen is soft.     Tenderness: There is abdominal tenderness in the periumbilical area.  Neurological:     General: No focal deficit present.     Mental Status: He is alert and oriented to person, place, and time.        Assessment & Plan:   1. Umbilical hernia with obstruction, without gangrene Urgent referral to Gen Surg for further eval/mgt - Ambulatory referral to General Surgery  2. Gastroesophageal reflux disease without esophagitis Omeprazole prescribed. Dietary and activity options discusse.   3. Snoring Obtain sleep study results for referral for CPAP  4. Class 2 obesity due to excess calories without serious comorbidity with body mass index  (BMI) of 39.0 to 39.9 in adult Discussed dietary and activity options. Goal is 3-5lbs/mo wt loss  5. Encounter to establish care     No follow-ups on file.   Tommie Raymond, MD

## 2021-08-03 ENCOUNTER — Ambulatory Visit: Payer: Self-pay | Admitting: Family Medicine

## 2021-08-12 ENCOUNTER — Encounter: Payer: 59 | Admitting: Family Medicine

## 2021-10-04 ENCOUNTER — Other Ambulatory Visit: Payer: Self-pay | Admitting: Family Medicine

## 2022-04-22 ENCOUNTER — Encounter: Payer: Self-pay | Admitting: Family Medicine

## 2022-04-22 ENCOUNTER — Ambulatory Visit: Payer: 59 | Admitting: Family Medicine

## 2022-04-22 VITALS — BP 138/91 | HR 100 | Temp 97.7°F | Resp 16 | Wt 244.0 lb

## 2022-04-22 DIAGNOSIS — K420 Umbilical hernia with obstruction, without gangrene: Secondary | ICD-10-CM

## 2022-04-22 DIAGNOSIS — K219 Gastro-esophageal reflux disease without esophagitis: Secondary | ICD-10-CM | POA: Diagnosis not present

## 2022-04-22 DIAGNOSIS — J4520 Mild intermittent asthma, uncomplicated: Secondary | ICD-10-CM

## 2022-04-22 MED ORDER — ALBUTEROL SULFATE HFA 108 (90 BASE) MCG/ACT IN AERS: 1.0000 | INHALATION_SPRAY | Freq: Four times a day (QID) | RESPIRATORY_TRACT | 5 refills | Status: DC | PRN

## 2022-04-22 MED ORDER — CETIRIZINE HCL 10 MG PO TABS: 10.0000 mg | ORAL_TABLET | Freq: Every day | ORAL | 5 refills | Status: DC

## 2022-04-22 NOTE — Progress Notes (Signed)
Patient is here for their 6 month follow-up Patient has no concerns today Care gaps have been discussed with patient  

## 2022-04-26 ENCOUNTER — Encounter: Payer: Self-pay | Admitting: Family Medicine

## 2022-04-26 NOTE — Progress Notes (Signed)
Established Patient Office Visit  Subjective    Patient ID: GILMER RABUCK, male    DOB: May 12, 1980  Age: 42 y.o. MRN: [id number]  CC:  Chief Complaint  Patient presents with   Follow-up    HPI Jared Chung presents for follow up of chronic med issues. Patient denies acute complaints or concerns.    Outpatient Encounter Medications as of 04/22/2022  Medication Sig   cetirizine (ZYRTEC) 10 MG tablet Take 1 tablet (10 mg total) by mouth daily.   fluticasone (FLONASE) 50 MCG/ACT nasal spray fluticasone propionate 50 mcg/actuation nasal spray,suspension  Spray 1 spray every day by intranasal route for 90 days.   loratadine (CLARITIN) 10 MG tablet loratadine 10 mg tablet  TAKE 1 TABLET BY MOUTH EVERY DAY   montelukast (SINGULAIR) 10 MG tablet montelukast 10 mg tablet  TAKE 1 TABLET BY MOUTH EVERY DAY FOR ASTHMA ALLERGY CONTROL   omeprazole (PRILOSEC) 20 MG capsule Take 1 capsule (20 mg total) by mouth daily.   [DISCONTINUED] albuterol (PROVENTIL HFA;VENTOLIN HFA) 108 (90 BASE) MCG/ACT inhaler Inhale 1-2 puffs into the lungs every 6 (six) hours as needed for wheezing.   albuterol (VENTOLIN HFA) 108 (90 Base) MCG/ACT inhaler Inhale 1-2 puffs into the lungs every 6 (six) hours as needed for wheezing.   No facility-administered encounter medications on file as of 04/22/2022.    Past Medical History:  Diagnosis Date   Asthma     Past Surgical History:  Procedure Laterality Date   MANDIBLE SURGERY      No family history on file.  Social History   Socioeconomic History   Marital status: Single    Spouse name: Not on file   Number of children: Not on file   Years of education: Not on file   Highest education level: Not on file  Occupational History   Not on file  Tobacco Use   Smoking status: Never   Smokeless tobacco: Never  Substance and Sexual Activity   Alcohol use: Yes    Comment: occasionally   Drug use: No   Sexual activity: Not on file  Other Topics  Concern   Not on file  Social History Narrative   Not on file   Social Determinants of Health   Financial Resource Strain: Low Risk  (04/22/2022)   Overall Financial Resource Strain (CARDIA)    Difficulty of Paying Living Expenses: Not hard at all  Food Insecurity: No Food Insecurity (04/22/2022)   Hunger Vital Sign    Worried About Running Out of Food in the Last Year: Never true    [place] in the Last Year: Never true  Transportation Needs: No Transportation Needs (04/22/2022)   PRAPARE - Hydrologist (Medical): No    Lack of Transportation (Non-Medical): No  Physical Activity: Sufficiently Active (04/22/2022)   Exercise Vital Sign    Days of Exercise per Week: 7 days    Minutes of Exercise per Session: 30 min  Stress: No Stress Concern Present (04/22/2022)   [place]    Feeling of Stress : Not at all  Social Connections: Moderately Integrated (04/22/2022)   Social Connection and Isolation Panel [NHANES]    Frequency of Communication with Friends and Family: More than three times a week    Frequency of Social Gatherings with Friends and Family: More than three times a week    Attends Religious Services: More than 4 times per  year    Active Member of Clubs or Organizations: Yes    Attends Archivist Meetings: More than 4 times per year    Marital Status: Separated  Intimate Partner Violence: Not At Risk (04/22/2022)   Humiliation, Afraid, Rape, and Kick questionnaire    Fear of Current or Ex-Partner: No    Emotionally Abused: No    Physically Abused: No    Sexually Abused: No    Review of Systems  All other systems reviewed and are negative.       Objective    BP (!) 138/91   Pulse 100   Temp 97.7 F (36.5 C) (Oral)   Resp 16   Wt 244 lb (110.7 kg)   SpO2 96%   BMI 36.03 kg/m   Physical Exam Vitals and nursing note reviewed.  Constitutional:       General: He is not in acute distress.    Appearance: He is obese.  Cardiovascular:     Rate and Rhythm: Normal rate and regular rhythm.  Pulmonary:     Effort: Pulmonary effort is normal.     Breath sounds: Normal breath sounds.  Abdominal:     Palpations: Abdomen is soft.     Tenderness: There is no abdominal tenderness.  Neurological:     General: No focal deficit present.     Mental Status: He is alert and oriented to person, place, and time.         Assessment & Plan:   1. Gastroesophageal reflux disease without esophagitis Discussed dietary and activity options.   2. Mild intermittent asthma without complication Appears stable. Continue. Zyrtec prescribed ad albuterol refilled.   3. Umbilical hernia with obstruction, without gangrene Referral for further eval/mgt - Ambulatory referral to General Surgery  Return in about 6 months (around 10/23/2022) for physical.   Becky Sax, MD

## 2022-08-02 ENCOUNTER — Emergency Department (HOSPITAL_COMMUNITY)
Admission: EM | Admit: 2022-08-02 | Discharge: 2022-08-03 | Disposition: A | Discharge status: Home / Self Care | Payer: 59 | Attending: Emergency Medicine | Admitting: Emergency Medicine

## 2022-08-02 ENCOUNTER — Emergency Department (HOSPITAL_COMMUNITY): Payer: 59

## 2022-08-02 ENCOUNTER — Encounter (HOSPITAL_COMMUNITY): Payer: Self-pay

## 2022-08-02 ENCOUNTER — Other Ambulatory Visit: Payer: Self-pay

## 2022-08-02 DIAGNOSIS — K429 Umbilical hernia without obstruction or gangrene: Secondary | ICD-10-CM | POA: Insufficient documentation

## 2022-08-02 DIAGNOSIS — J45909 Unspecified asthma, uncomplicated: Secondary | ICD-10-CM | POA: Diagnosis not present

## 2022-08-02 DIAGNOSIS — R1030 Lower abdominal pain, unspecified: Secondary | ICD-10-CM | POA: Diagnosis present

## 2022-08-02 LAB — COMPREHENSIVE METABOLIC PANEL
ALT: 44 U/L (ref 0–44)
AST: 22 U/L (ref 15–41)
Albumin: 4.1 g/dL (ref 3.5–5.0)
Alkaline Phosphatase: 73 U/L (ref 38–126)
Anion gap: 15 (ref 5–15)
BUN: 15 mg/dL (ref 6–20)
CO2: 24 mmol/L (ref 22–32)
Calcium: 9.5 mg/dL (ref 8.9–10.3)
Chloride: 101 mmol/L (ref 98–111)
Creatinine, Ser: 0.87 mg/dL (ref 0.61–1.24)
GFR, Estimated: >60 mL/min (ref >60)
Glucose, Bld: 109 mg/dL — ABNORMAL HIGH (ref 70–99)
Potassium: 4.3 mmol/L (ref 3.5–5.1)
Sodium: 140 mmol/L (ref 135–145)
Total Bilirubin: 1 mg/dL (ref 0.3–1.2)
Total Protein: 7.7 g/dL (ref 6.5–8.1)

## 2022-08-02 LAB — CBC
HCT: 49.4 % (ref 39.0–52.0)
Hemoglobin: 16 g/dL (ref 13.0–17.0)
MCH: 26.4 pg (ref 26.0–34.0)
MCHC: 32.4 g/dL (ref 30.0–36.0)
MCV: 81.5 fL (ref 80.0–100.0)
Platelets: 201 10*3/uL (ref 150–400)
RBC: 6.06 MIL/uL — ABNORMAL HIGH (ref 4.22–5.81)
RDW: 14.8 % (ref 11.5–15.5)
WBC: 9 10*3/uL (ref 4.0–10.5)
nRBC: 0 % (ref 0.0–0.2)

## 2022-08-02 LAB — LIPASE, BLOOD: Lipase: 26 U/L (ref 11–51)

## 2022-08-02 MED ORDER — IOHEXOL 350 MG/ML SOLN
75.0000 mL | Freq: Once | INTRAVENOUS | Status: AC | PRN
  Administered 2022-08-02: 75 mL via INTRAVENOUS

## 2022-08-02 NOTE — ED Triage Notes (Signed)
Pt came in via POV d/t abd pain in the RLQ that began this morning. A/Ox4, rates pain 10/10, denies n/v. Does have Hx of hernia.

## 2022-08-02 NOTE — ED Provider Notes (Incomplete)
[place] EMERGENCY DEPARTMENT AT [facility] Provider Note   CSN: [id number] Arrival date & time: 08/02/22  1442     History {Add pertinent medical, surgical, social history, OB history to HPI:1} Chief Complaint  Patient presents with  . Abdominal Pain    Jared Chung is a 42 y.o. male.  42 year old male with complaint of right mid to lower abdominal pain onset this morning, worse with coughing.  Denies changes in bowel or bladder habits, fevers, chills, nausea, vomiting.  Known umbilical hernia, has scheduled appointment to the general surgery but is not currently scheduled until September or so.  No prior abdominal surgeries.  No other complaints or concerns.       Home Medications Prior to Admission medications   Medication Sig Start Date End Date Taking? Authorizing Provider  albuterol (VENTOLIN HFA) 108 (90 Base) MCG/ACT inhaler Inhale 1-2 puffs into the lungs every 6 (six) hours as needed for wheezing. 04/22/22   Georganna Skeans, MD  cetirizine (ZYRTEC) 10 MG tablet Take 1 tablet (10 mg total) by mouth daily. 04/22/22   Georganna Skeans, MD  fluticasone [facility]) 50 MCG/ACT nasal spray fluticasone propionate 50 mcg/actuation nasal spray,suspension  Spray 1 spray every day by intranasal route for 90 days.    [provider]  loratadine (CLARITIN) 10 MG tablet loratadine 10 mg tablet  TAKE 1 TABLET BY MOUTH EVERY DAY    [provider]  montelukast (SINGULAIR) 10 MG tablet montelukast 10 mg tablet  TAKE 1 TABLET BY MOUTH EVERY DAY FOR ASTHMA ALLERGY CONTROL    [provider]  omeprazole (PRILOSEC) 20 MG capsule Take 1 capsule (20 mg total) by mouth daily. 07/09/21   Georganna Skeans, MD      Allergies    Penicillins    Review of Systems   Review of Systems Negative except as per HPI Physical Exam Updated Vital Signs BP 123/79 (BP Location: Left Arm)   Pulse 90   Temp 98.6 F (37 C)   Resp 16   SpO2 96%  Physical Exam Vitals and  nursing note reviewed.  Constitutional:      General: He is not in acute distress.    Appearance: He is well-developed. He is not diaphoretic.  HENT:     Head: Normocephalic and atraumatic.  Cardiovascular:     Rate and Rhythm: Normal rate and regular rhythm.     Heart sounds: Normal heart sounds.  Pulmonary:     Effort: Pulmonary effort is normal.     Breath sounds: Normal breath sounds.  Abdominal:     Palpations: Abdomen is soft.     Tenderness: There is generalized abdominal tenderness. There is no guarding or rebound.     Hernia: A hernia is present. Hernia is present in the umbilical area.  Skin:    General: Skin is warm and dry.     Findings: No erythema or rash.  Neurological:     Mental Status: He is alert and oriented to person, place, and time.  Psychiatric:        Behavior: Behavior normal.     ED Results / Procedures / Treatments   Labs (all labs ordered are listed, but only abnormal results are displayed) Labs Reviewed  COMPREHENSIVE METABOLIC PANEL - Abnormal; Notable for the following components:      Result Value   Glucose, Bld 109 (*)    All other components within normal limits  CBC - Abnormal; Notable for the following components:  RBC 6.06 (*)    All other components within normal limits  LIPASE, BLOOD  URINALYSIS, ROUTINE W REFLEX MICROSCOPIC    EKG None  Radiology CT ABDOMEN PELVIS W CONTRAST  Result Date: 08/02/2022 CLINICAL DATA:  RLQ abdominal pain EXAM: CT ABDOMEN AND PELVIS WITH CONTRAST TECHNIQUE: Multidetector CT imaging of the abdomen and pelvis was performed using the standard protocol following bolus administration of intravenous contrast. RADIATION DOSE REDUCTION: This exam was performed according to the departmental dose-optimization program which includes automated exposure control, adjustment of the mA and/or kV according to patient size and/or use of iterative reconstruction technique. CONTRAST:  75mL OMNIPAQUE IOHEXOL 350 MG/ML  SOLN COMPARISON:  CT abdomen pelvis 08/09/2008 FINDINGS: Lower chest: No acute abnormality. Hepatobiliary: No focal liver abnormality. No gallstones, gallbladder wall thickening, or pericholecystic fluid. No biliary dilatation. Pancreas: No focal lesion. Normal pancreatic contour. No surrounding inflammatory changes. No main pancreatic ductal dilatation. Spleen: Normal in size without focal abnormality. Splenule noted. Adrenals/Urinary Tract: No adrenal nodule bilaterally. Bilateral kidneys enhance symmetrically. No hydronephrosis. No hydroureter. The urinary bladder is unremarkable. Stomach/Bowel: Stomach is within normal limits. No evidence of bowel wall thickening or dilatation. Appendix appears normal. Vascular/Lymphatic: No abdominal aorta or iliac aneurysm. No abdominal, pelvic, or inguinal lymphadenopathy. Reproductive: Prostate is unremarkable. Other: No intraperitoneal free fluid. No intraperitoneal free gas. No organized fluid collection. Musculoskeletal: Interval increase in size of a small to moderate volume umbilical hernia containing fat with associated marked fat stranding and trace free fluid. Abdominal defect of 2.5 cm by 1.6 cm. No suspicious lytic or blastic osseous lesions. No acute displaced fracture. Multilevel degenerative changes of the spine. IMPRESSION: Small to moderate volume umbilical hernia containing fat with associated marked fat stranding and trace free fluid. Finding concerning for incarcerated hernia. Recommend correlation with physical exam. Electronically Signed   By: Tish Frederickson M.D.   On: 08/02/2022 17:57    Procedures Hernia reduction  Date/Time: 08/02/2022 11:59 PM  Performed by: Jeannie Fend, PA-C Authorized by: Jeannie Fend, PA-C  Consent: Verbal consent obtained. Risks and benefits: risks, benefits and alternatives were discussed Consent given by: patient Patient identity confirmed: verbally with patient Local anesthesia used: no  Anesthesia: Local  anesthesia used: no  Sedation: Patient sedated: no  Patient tolerance: patient tolerated the procedure well with no immediate complications Comments: Umbilical hernia reduced with gentle pressure.  Symptoms resolved.     {Document cardiac monitor, telemetry assessment procedure when appropriate:1}  Medications Ordered in ED Medications  iohexol (OMNIPAQUE) 350 MG/ML injection 75 mL (75 mLs Intravenous Contrast Given 08/02/22 1751)    ED Course/ Medical Decision Making/ A&P   {   Click here for ABCD2, HEART and other calculatorsREFRESH Note before signing :1}                          Medical Decision Making  This patient presents to the ED for concern of abdominal pain, this involves an extensive number of treatment options, and is a complaint that carries with it a high risk of complications and morbidity.  The differential diagnosis includes but not limited to appendicitis, colitis, diverticulitis, incarcerated hernia    Co morbidities that complicate the patient evaluation  Asthma, GERD, umbilical hernia   Additional history obtained:  Additional history obtained from significant other at bedside who contributes to history as above External records from outside source obtained and reviewed including prior labs on file for comparison   Lab Tests:  I Ordered, and personally interpreted labs.  The pertinent results include: CBC without negative findings.  Lipase within limits.  CMP without significant findings.   Imaging Studies ordered:  I ordered imaging studies including CT abdomen pelvis with contrast I independently visualized and interpreted imaging which showed umbilical hernia I agree with the radiologist interpretation    Problem List / ED Course / Critical interventions / Medication management  *** I have reviewed the patients home medicines and have made adjustments as needed   Social Determinants of Health:  Lives with family, has PCP.   Test /  Admission - Considered:  Hernia reduced, symptoms resolved. Stable for dc to follow up with general surgery with return precautions    {Document critical care time when appropriate:1} {Document review of labs and clinical decision tools ie heart score, Chads2Vasc2 etc:1}  {Document your independent review of radiology images, and any outside records:1} {Document your discussion with family members, caretakers, and with consultants:1} {Document social determinants of health affecting pt's care:1} {Document your decision making why or why not admission, treatments were needed:1} Final Clinical Impression(s) / ED Diagnoses Final diagnoses:  None    Rx / DC Orders ED Discharge Orders     None

## 2022-08-02 NOTE — ED Provider Notes (Signed)
[place] EMERGENCY DEPARTMENT AT [facility] Provider Note   CSN: [id number] Arrival date & time: 08/02/22  1442     History {Add pertinent medical, surgical, social history, OB history to HPI:1} Chief Complaint  Patient presents with  . Abdominal Pain    Jared Chung is a 42 y.o. male.  HPI     Home Medications Prior to Admission medications   Medication Sig Start Date End Date Taking? Authorizing Provider  albuterol (VENTOLIN HFA) 108 (90 Base) MCG/ACT inhaler Inhale 1-2 puffs into the lungs every 6 (six) hours as needed for wheezing. 04/22/22   Georganna Skeans, MD  cetirizine (ZYRTEC) 10 MG tablet Take 1 tablet (10 mg total) by mouth daily. 04/22/22   Georganna Skeans, MD  fluticasone [facility]) 50 MCG/ACT nasal spray fluticasone propionate 50 mcg/actuation nasal spray,suspension  Spray 1 spray every day by intranasal route for 90 days.    [provider]  loratadine (CLARITIN) 10 MG tablet loratadine 10 mg tablet  TAKE 1 TABLET BY MOUTH EVERY DAY    [provider]  montelukast (SINGULAIR) 10 MG tablet montelukast 10 mg tablet  TAKE 1 TABLET BY MOUTH EVERY DAY FOR ASTHMA ALLERGY CONTROL    [provider]  omeprazole (PRILOSEC) 20 MG capsule Take 1 capsule (20 mg total) by mouth daily. 07/09/21   Georganna Skeans, MD      Allergies    Penicillins    Review of Systems   Review of Systems  Physical Exam Updated Vital Signs BP 123/79 (BP Location: Left Arm)   Pulse 90   Temp 98.6 F (37 C)   Resp 16   SpO2 96%  Physical Exam  ED Results / Procedures / Treatments   Labs (all labs ordered are listed, but only abnormal results are displayed) Labs Reviewed  COMPREHENSIVE METABOLIC PANEL - Abnormal; Notable for the following components:      Result Value   Glucose, Bld 109 (*)    All other components within normal limits  CBC - Abnormal; Notable for the following components:   RBC 6.06 (*)    All other components within normal  limits  LIPASE, BLOOD  URINALYSIS, ROUTINE W REFLEX MICROSCOPIC    EKG None  Radiology CT ABDOMEN PELVIS W CONTRAST  Result Date: 08/02/2022 CLINICAL DATA:  RLQ abdominal pain EXAM: CT ABDOMEN AND PELVIS WITH CONTRAST TECHNIQUE: Multidetector CT imaging of the abdomen and pelvis was performed using the standard protocol following bolus administration of intravenous contrast. RADIATION DOSE REDUCTION: This exam was performed according to the departmental dose-optimization program which includes automated exposure control, adjustment of the mA and/or kV according to patient size and/or use of iterative reconstruction technique. CONTRAST:  75mL OMNIPAQUE IOHEXOL 350 MG/ML SOLN COMPARISON:  CT abdomen pelvis 08/09/2008 FINDINGS: Lower chest: No acute abnormality. Hepatobiliary: No focal liver abnormality. No gallstones, gallbladder wall thickening, or pericholecystic fluid. No biliary dilatation. Pancreas: No focal lesion. Normal pancreatic contour. No surrounding inflammatory changes. No main pancreatic ductal dilatation. Spleen: Normal in size without focal abnormality. Splenule noted. Adrenals/Urinary Tract: No adrenal nodule bilaterally. Bilateral kidneys enhance symmetrically. No hydronephrosis. No hydroureter. The urinary bladder is unremarkable. Stomach/Bowel: Stomach is within normal limits. No evidence of bowel wall thickening or dilatation. Appendix appears normal. Vascular/Lymphatic: No abdominal aorta or iliac aneurysm. No abdominal, pelvic, or inguinal lymphadenopathy. Reproductive: Prostate is unremarkable. Other: No intraperitoneal free fluid. No intraperitoneal free gas. No organized fluid collection. Musculoskeletal: Interval increase in size of a small to moderate volume  umbilical hernia containing fat with associated marked fat stranding and trace free fluid. Abdominal defect of 2.5 cm by 1.6 cm. No suspicious lytic or blastic osseous lesions. No acute displaced fracture. Multilevel  degenerative changes of the spine. IMPRESSION: Small to moderate volume umbilical hernia containing fat with associated marked fat stranding and trace free fluid. Finding concerning for incarcerated hernia. Recommend correlation with physical exam. Electronically Signed   By: Tish Frederickson M.D.   On: 08/02/2022 17:57    Procedures Procedures  {Document cardiac monitor, telemetry assessment procedure when appropriate:1}  Medications Ordered in ED Medications  iohexol (OMNIPAQUE) 350 MG/ML injection 75 mL (75 mLs Intravenous Contrast Given 08/02/22 1751)    ED Course/ Medical Decision Making/ A&P   {   Click here for ABCD2, HEART and other calculatorsREFRESH Note before signing :1}                          Medical Decision Making  ***  {Document critical care time when appropriate:1} {Document review of labs and clinical decision tools ie heart score, Chads2Vasc2 etc:1}  {Document your independent review of radiology images, and any outside records:1} {Document your discussion with family members, caretakers, and with consultants:1} {Document social determinants of health affecting pt's care:1} {Document your decision making why or why not admission, treatments were needed:1} Final Clinical Impression(s) / ED Diagnoses Final diagnoses:  None    Rx / DC Orders ED Discharge Orders     None

## 2022-08-02 NOTE — Discharge Instructions (Signed)
Wear binder to help with hernia. Use metamucil to help keep stools soft and limit constipation.   If hernia returns, lie down on your back, apply gentle pressure to area.  You can also apply a cold compress. If unable to reduce hernia at home, return to ER for severe pain or other concerning symptoms.  Follow up with general surgery, call to schedule an appointment.

## 2022-08-02 NOTE — ED Provider Triage Note (Signed)
Emergency Medicine Provider Triage Evaluation Note  Jared Chung , a 42 y.o. male  was evaluated in triage.  Pt complains of RLQ abdominal pain since earlier this AM. No other symptoms. No medications trialed.  Review of Systems  Positive:  Negative:   Physical Exam  BP (!) 139/95   Pulse 86   Temp 99.5 F (37.5 C) (Oral)   Resp 18   SpO2 96%  Gen:   Awake, no distress   Resp:  Normal effort  MSK:   Moves extremities without difficulty  Other:  Reducible umbilical hernia. RLQ tenderness. SOFT.  Medical Decision Making  Medically screening exam initiated at 4:08 PM.  Appropriate orders placed.  Deirdre Evener was informed that the remainder of the evaluation will be completed by another provider, this initial triage assessment does not replace that evaluation, and the importance of remaining in the ED until their evaluation is complete.  CT ordered and labs.    Achille Rich, [place] 08/02/22 1609

## 2022-10-13 ENCOUNTER — Other Ambulatory Visit: Payer: Self-pay | Admitting: Family Medicine

## 2022-10-13 ENCOUNTER — Other Ambulatory Visit: Payer: Self-pay

## 2022-10-25 ENCOUNTER — Encounter: Payer: Self-pay | Admitting: Family Medicine

## 2022-10-25 ENCOUNTER — Ambulatory Visit (INDEPENDENT_AMBULATORY_CARE_PROVIDER_SITE_OTHER): Payer: 59 | Admitting: Family Medicine

## 2022-10-25 VITALS — BP 106/78 | HR 89 | Temp 98.0°F | Resp 18 | Ht 69.0 in | Wt 260.2 lb

## 2022-10-25 DIAGNOSIS — Z13220 Encounter for screening for lipoid disorders: Secondary | ICD-10-CM

## 2022-10-25 DIAGNOSIS — Z1159 Encounter for screening for other viral diseases: Secondary | ICD-10-CM

## 2022-10-25 DIAGNOSIS — Z0000 Encounter for general adult medical examination without abnormal findings: Secondary | ICD-10-CM

## 2022-10-25 DIAGNOSIS — Z130 Encounter for screening for diseases of the blood and blood-forming organs and certain disorders involving the immune mechanism: Secondary | ICD-10-CM

## 2022-10-25 DIAGNOSIS — Z114 Encounter for screening for human immunodeficiency virus [HIV]: Secondary | ICD-10-CM

## 2022-10-25 MED ORDER — MONTELUKAST SODIUM 10 MG PO TABS: 10.0000 mg | ORAL_TABLET | Freq: Every day | ORAL | 3 refills | Status: DC

## 2022-10-25 MED ORDER — CETIRIZINE HCL 10 MG PO TABS: 10.0000 mg | ORAL_TABLET | Freq: Every day | ORAL | 3 refills | Status: AC

## 2022-10-26 ENCOUNTER — Encounter: Payer: Self-pay | Admitting: Family Medicine

## 2022-10-26 LAB — CBC WITH DIFFERENTIAL/PLATELET
Basophils Absolute: 0.1 10*3/uL (ref 0.0–0.2)
Basos: 1 %
EOS (ABSOLUTE): 0.4 10*3/uL (ref 0.0–0.4)
Eos: 4 %
Hematocrit: 48.1 % (ref 37.5–51.0)
Hemoglobin: 15.4 g/dL (ref 13.0–17.7)
Immature Grans (Abs): 0 10*3/uL (ref 0.0–0.1)
Immature Granulocytes: 0 %
Lymphocytes Absolute: 3 10*3/uL (ref 0.7–3.1)
Lymphs: 36 %
MCH: 27.1 pg (ref 26.6–33.0)
MCHC: 32 g/dL (ref 31.5–35.7)
MCV: 85 fL (ref 79–97)
Monocytes Absolute: 0.7 10*3/uL (ref 0.1–0.9)
Monocytes: 8 %
Neutrophils Absolute: 4.3 10*3/uL (ref 1.4–7.0)
Neutrophils: 51 %
Platelets: 211 10*3/uL (ref 150–450)
RBC: 5.68 x10E6/uL (ref 4.14–5.80)
RDW: 14.5 % (ref 11.6–15.4)
WBC: 8.5 10*3/uL (ref 3.4–10.8)

## 2022-10-26 LAB — HEPATITIS C ANTIBODY: Hep C Virus Ab: NONREACTIVE

## 2022-10-26 LAB — COMPREHENSIVE METABOLIC PANEL
ALT: 62 IU/L — ABNORMAL HIGH (ref 0–44)
AST: 21 IU/L (ref 0–40)
Albumin: 4.3 g/dL (ref 4.1–5.1)
Alkaline Phosphatase: 102 IU/L (ref 44–121)
BUN/Creatinine Ratio: 11 (ref 9–20)
BUN: 12 mg/dL (ref 6–24)
Bilirubin Total: 0.3 mg/dL (ref 0.0–1.2)
CO2: 25 mmol/L (ref 20–29)
Calcium: 9.7 mg/dL (ref 8.7–10.2)
Chloride: 103 mmol/L (ref 96–106)
Creatinine, Ser: 1.09 mg/dL (ref 0.76–1.27)
Globulin, Total: 2.5 g/dL (ref 1.5–4.5)
Glucose: 118 mg/dL — ABNORMAL HIGH (ref 70–99)
Potassium: 4.2 mmol/L (ref 3.5–5.2)
Sodium: 141 mmol/L (ref 134–144)
Total Protein: 6.8 g/dL (ref 6.0–8.5)
eGFR: 86 mL/min/{1.73_m2} (ref >59)

## 2022-10-26 LAB — LIPID PANEL
Chol/HDL Ratio: 5.6 ratio — ABNORMAL HIGH (ref 0.0–5.0)
Cholesterol, Total: 212 mg/dL — ABNORMAL HIGH (ref 100–199)
HDL: 38 mg/dL — ABNORMAL LOW (ref >39)
LDL Chol Calc (NIH): 128 mg/dL — ABNORMAL HIGH (ref 0–99)
Triglycerides: 259 mg/dL — ABNORMAL HIGH (ref 0–149)
VLDL Cholesterol Cal: 46 mg/dL — ABNORMAL HIGH (ref 5–40)

## 2022-10-26 LAB — HIV ANTIBODY (ROUTINE TESTING W REFLEX): HIV Screen 4th Generation wRfx: NONREACTIVE

## 2022-10-26 NOTE — Progress Notes (Signed)
Established Patient Office Visit  Subjective    Patient ID: Jared Chung, male    DOB: 29-May-1980  Age: 42 y.o. MRN: [id number]  CC:  Chief Complaint  Patient presents with   Annual Exam    HPI Jared Chung presents for routine annual exam. Patient denies acute complaints.   Outpatient Encounter Medications as of 10/25/2022  Medication Sig   albuterol (VENTOLIN HFA) 108 (90 Base) MCG/ACT inhaler Inhale 1-2 puffs into the lungs every 6 (six) hours as needed for wheezing.   Cholecalciferol 125 MCG (5000 UT) TABS Take 1 tablet every day by oral route as directed for 110 days.   esomeprazole (NEXIUM) 40 MG capsule Take 1 capsule every day by oral route for 30 days.   ezetimibe (ZETIA) 10 MG tablet Take 1 tablet every day by oral route.   flunisolide (NASALIDE) 25 MCG/ACT (0.025%) SOLN    fluticasone (FLONASE) 50 MCG/ACT nasal spray fluticasone propionate 50 mcg/actuation nasal spray,suspension  Spray 1 spray every day by intranasal route for 90 days.   lidocaine (XYLOCAINE) 1 % (with preservative) injection by Infiltration route.   loratadine (CLARITIN) 10 MG tablet loratadine 10 mg tablet  TAKE 1 TABLET BY MOUTH EVERY DAY   omeprazole (PRILOSEC) 20 MG capsule Take 1 capsule (20 mg total) by mouth daily.   triamcinolone acetonide (KENALOG-40) 40 MG/ML injection Inject into the articular space.   [DISCONTINUED] cetirizine (ZYRTEC) 10 MG tablet TAKE 1 TABLET BY MOUTH EVERY DAY   [DISCONTINUED] montelukast (SINGULAIR) 10 MG tablet montelukast 10 mg tablet  TAKE 1 TABLET BY MOUTH EVERY DAY FOR ASTHMA ALLERGY CONTROL   cetirizine (ZYRTEC) 10 MG tablet Take 1 tablet (10 mg total) by mouth daily.   dexlansoprazole (DEXILANT) 60 MG capsule Take 1 capsule by mouth daily.   montelukast (SINGULAIR) 10 MG tablet Take 1 tablet (10 mg total) by mouth at bedtime.   ondansetron (ZOFRAN) 8 MG tablet TAKE 1 TABLET(S) EVERY 8 HOURS BY ORAL ROUTE FOR 2 DAYS.   predniSONE (DELTASONE) 20 MG tablet  PLEASE SEE ATTACHED FOR DETAILED DIRECTIONS   trimethoprim-polymyxin b (POLYTRIM) ophthalmic solution    No facility-administered encounter medications on file as of 10/25/2022.    Past Medical History:  Diagnosis Date   Asthma     Past Surgical History:  Procedure Laterality Date   MANDIBLE SURGERY      History reviewed. No pertinent family history.  Social History   Socioeconomic History   Marital status: Single    Spouse name: Not on file   Number of children: Not on file   Years of education: Not on file   Highest education level: Not on file  Occupational History   Not on file  Tobacco Use   Smoking status: Never   Smokeless tobacco: Never  Substance and Sexual Activity   Alcohol use: Yes    Comment: occasionally   Drug use: No   Sexual activity: Not on file  Other Topics Concern   Not on file  Social History Narrative   Not on file   Social Determinants of Health   Financial Resource Strain: Low Risk  (04/22/2022)   Overall Financial Resource Strain (CARDIA)    Difficulty of Paying Living Expenses: Not hard at all  Food Insecurity: No Food Insecurity (04/22/2022)   Hunger Vital Sign    Worried About Running Out of Food in the Last Year: Never true    Ran Out of Food in the Last Year: Never true  Transportation Needs: No Transportation Needs (04/22/2022)   PRAPARE - Administrator, Civil Service (Medical): No    Lack of Transportation (Non-Medical): No  Physical Activity: Sufficiently Active (04/22/2022)   Exercise Vital Sign    Days of Exercise per Week: 7 days    Minutes of Exercise per Session: 30 min  Stress: No Stress Concern Present (10/25/2022)   [employer] of Occupational Health - Occupational Stress Questionnaire    Feeling of Stress : Only a little  Social Connections: Moderately Integrated (04/22/2022)   Social Connection and Isolation Panel [NHANES]    Frequency of Communication with Friends and Family: More than three times a  week    Frequency of Social Gatherings with Friends and Family: More than three times a week    Attends Religious Services: More than 4 times per year    Active Member of [employer] or Organizations: Yes    Attends Banker Meetings: More than 4 times per year    Marital Status: Separated  Intimate Partner Violence: Not At Risk (04/22/2022)   Humiliation, Afraid, Rape, and Kick questionnaire    Fear of Current or Ex-Partner: No    Emotionally Abused: No    Physically Abused: No    Sexually Abused: No    Review of Systems  All other systems reviewed and are negative.       Objective    BP 106/78 (BP Location: Right Arm, Patient Position: Sitting) Comment (Cuff Size): ex large  Pulse 89   Temp 98 F (36.7 C) (Oral)   Resp 18   Ht 5\' 9"  (1.753 m)   Wt 260 lb 3.2 oz (118 kg)   SpO2 93%   BMI 38.42 kg/m   Physical Exam Vitals and nursing note reviewed.  Constitutional:      General: He is not in acute distress. HENT:     Head: Normocephalic and atraumatic.     Right Ear: Tympanic membrane, ear canal and external ear normal.     Left Ear: Tympanic membrane, ear canal and external ear normal.     Nose: Nose normal.     Mouth/Throat:     Mouth: Mucous membranes are moist.     Pharynx: Oropharynx is clear.  Eyes:     Conjunctiva/sclera: Conjunctivae normal.     Pupils: Pupils are equal, round, and reactive to light.  Neck:     Thyroid: No thyromegaly.  Cardiovascular:     Rate and Rhythm: Normal rate and regular rhythm.     Heart sounds: Normal heart sounds. No murmur heard. Pulmonary:     Effort: Pulmonary effort is normal.     Breath sounds: Normal breath sounds.  Abdominal:     General: There is no distension.     Palpations: Abdomen is soft. There is no mass.     Tenderness: There is no abdominal tenderness.     Hernia: There is no hernia in the left inguinal area or right inguinal area.  Musculoskeletal:        General: Normal range of motion.      Cervical back: Normal range of motion and neck supple.     Right lower leg: No edema.     Left lower leg: No edema.  Skin:    General: Skin is warm and dry.  Neurological:     General: No focal deficit present.     Mental Status: He is alert and oriented to person, place, and time. Mental status is  at baseline.  Psychiatric:        Mood and Affect: Mood normal.        Behavior: Behavior normal.         Assessment & Plan:   Annual physical exam -     Comprehensive metabolic panel  Screening for deficiency anemia -     CBC with Differential/Platelet  Screening for lipid disorders -     Lipid panel  Screening for HIV (human immunodeficiency virus) -     HIV Antibody (routine testing w rflx)  Need for hepatitis C screening test -     Hepatitis C antibody  Other orders -     Montelukast Sodium; Take 1 tablet (10 mg total) by mouth at bedtime.  Dispense: 90 tablet; Refill: 3 -     Cetirizine HCl; Take 1 tablet (10 mg total) by mouth daily.  Dispense: 90 tablet; Refill: 3     No follow-ups on file.   Tommie Raymond, MD

## 2023-04-07 DIAGNOSIS — M65839 Other synovitis and tenosynovitis, unspecified forearm: Secondary | ICD-10-CM | POA: Insufficient documentation

## 2023-04-07 DIAGNOSIS — M654 Radial styloid tenosynovitis [de Quervain]: Secondary | ICD-10-CM | POA: Insufficient documentation

## 2023-05-07 ENCOUNTER — Ambulatory Visit
Admission: EM | Admit: 2023-05-07 | Discharge: 2023-05-07 | Disposition: A | Discharge status: Home / Self Care | Attending: Internal Medicine | Admitting: Internal Medicine

## 2023-05-07 DIAGNOSIS — S39012A Strain of muscle, fascia and tendon of lower back, initial encounter: Secondary | ICD-10-CM | POA: Diagnosis not present

## 2023-05-07 MED ORDER — KETOROLAC TROMETHAMINE 30 MG/ML IJ SOLN
30.0000 mg | Freq: Once | INTRAMUSCULAR | Status: AC
  Administered 2023-05-07: 30 mg via INTRAMUSCULAR

## 2023-05-07 MED ORDER — METHOCARBAMOL 500 MG PO TABS: 500.0000 mg | ORAL_TABLET | Freq: Two times a day (BID) | ORAL | 0 refills | Status: DC

## 2023-05-07 MED ORDER — DEXAMETHASONE SODIUM PHOSPHATE 10 MG/ML IJ SOLN
10.0000 mg | Freq: Once | INTRAMUSCULAR | Status: AC
  Administered 2023-05-07: 10 mg via INTRAMUSCULAR

## 2023-05-07 MED ORDER — PREDNISONE 20 MG PO TABS: 40.0000 mg | ORAL_TABLET | Freq: Every day | ORAL | 0 refills | Status: AC

## 2023-05-07 NOTE — ED Provider Notes (Signed)
 [facility]    CSN: [id number] Arrival date & time: 05/07/23  1131      History   Chief Complaint Chief Complaint  Patient presents with   Back Pain    HPI DMITRIY GAIR is a 43 y.o. male.   43 year old male who presents urgent care with complaints of thoracic back pain.  This started about 2 weeks ago after pulling heavy objects at work.  It has not gotten much better in the last 2 weeks.  He has been using stretching, heating pad and over-the-counter medication.  He reports that he did just start doing more heavy lifting at work in the last 2 months but this is the first time his back has bothered him.  He denies any specific injury otherwise.  He denies any history of injury to the back in the past.  He is not having any radiating pain.  The area is worse when he bends over and then stands back up.  There is spasms in the area at times.    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 Authorizing Provider  cetirizine (ZYRTEC) 10 MG tablet Take 1 tablet (10 mg total) by mouth daily. 10/25/22  Yes Georganna Skeans, MD  montelukast (SINGULAIR) 10 MG tablet Take 1 tablet (10 mg total) by mouth at bedtime. 10/25/22  Yes Georganna Skeans, MD  albuterol (VENTOLIN HFA) 108 (90 Base) MCG/ACT inhaler Inhale 1-2 puffs into the lungs every 6 (six) hours as needed for  wheezing. 04/22/22   Georganna Skeans, MD  Cholecalciferol 125 MCG (5000 UT) TABS Take 1 tablet every day by oral route as directed for 110 days. 09/15/19   [provider]  dexlansoprazole (DEXILANT) 60 MG capsule Take 1 capsule by mouth daily.    [provider]  esomeprazole (NEXIUM) 40 MG capsule Take 1 capsule every day by oral route for 30 days. 12/22/16   [provider]  ezetimibe (ZETIA) 10 MG tablet Take 1 tablet every day by oral route. 11/22/14   [provider]  flunisolide (NASALIDE) 25 MCG/ACT (0.025%) SOLN  12/24/21   [provider]  fluticasone (FLONASE) 50 MCG/ACT nasal spray fluticasone propionate 50 mcg/actuation nasal spray,suspension  Spray 1 spray every day by intranasal route for 90 days.    [provider]  lidocaine (XYLOCAINE) 1 % (with preservative) injection by Infiltration route. 12/24/21   [provider]  loratadine (CLARITIN) 10 MG tablet loratadine 10 mg tablet  TAKE 1 TABLET BY MOUTH EVERY DAY    [provider]  omeprazole (PRILOSEC) 20 MG capsule Take 1 capsule (20 mg total) by mouth daily. 07/09/21   Georganna Skeans, MD  ondansetron (ZOFRAN) 8 MG tablet TAKE 1 TABLET(S) EVERY 8 HOURS BY ORAL ROUTE FOR 2 DAYS.    [provider]  predniSONE (DELTASONE) 20 MG  tablet PLEASE SEE ATTACHED FOR DETAILED DIRECTIONS    [provider]  triamcinolone acetonide (KENALOG-40) 40 MG/ML injection Inject into the articular space. 12/24/21   [provider]  trimethoprim-polymyxin b (POLYTRIM) ophthalmic solution     [provider]    Family History History reviewed. No pertinent family history.  Social History Social History   Tobacco Use   Smoking status: Never   Smokeless tobacco: Never  Vaping Use   Vaping status: Never Used  Substance Use Topics   Alcohol use: Yes    Comment: occasionally   Drug use: No     Allergies   Penicillins   Review of  Systems Review of Systems  Constitutional:  Negative for chills and fever.  HENT:  Negative for ear pain and sore throat.   Eyes:  Negative for pain and visual disturbance.  Respiratory:  Negative for cough and shortness of breath.   Cardiovascular:  Negative for chest pain and palpitations.  Gastrointestinal:  Negative for abdominal pain and vomiting.  Genitourinary:  Negative for dysuria and hematuria.  Musculoskeletal:  Positive for back pain. Negative for arthralgias.  Skin:  Negative for color change and rash.  Neurological:  Negative for seizures and syncope.  All other systems reviewed and are negative.    Physical Exam Triage Vital Signs ED Triage Vitals  Encounter Vitals Group     BP 05/07/23 1242 122/77     Systolic BP Percentile --      Diastolic BP Percentile --      Pulse Rate 05/07/23 1242 88     Resp 05/07/23 1242 20     Temp 05/07/23 1242 98.3 F (36.8 C)     Temp Source 05/07/23 1242 Oral     SpO2 05/07/23 1242 96 %     Weight 05/07/23 1241 260 lb 2.3 oz (118 kg)     Height 05/07/23 1241 5\' 9"  (1.753 m)     Head Circumference --      Peak Flow --      Pain Score 05/07/23 1238 0     Pain Loc --      Pain Education --      Exclude from Growth Chart --    No data found.  Updated Vital Signs BP 122/77 (BP Location: Left Arm)   Pulse 88   Temp 98.3 F (36.8 C) (Oral)   Resp 20   Ht 5\' 9"  (1.753 m)   Wt 260 lb 2.3 oz (118 kg)   SpO2 96%   BMI 38.42 kg/m   Visual Acuity Right Eye Distance:   Left Eye Distance:   Bilateral Distance:    Right Eye Near:   Left Eye Near:    Bilateral Near:     Physical Exam Vitals and nursing note reviewed.  Constitutional:      General: He is not in acute distress.    Appearance: He is well-developed.  HENT:     Head: Normocephalic and atraumatic.  Eyes:     Conjunctiva/sclera: Conjunctivae normal.  Cardiovascular:     Rate and Rhythm: Normal rate and regular rhythm.     Heart sounds: No murmur  heard. Pulmonary:     Effort: Pulmonary effort is normal. No respiratory distress.     Breath sounds: Normal breath sounds.  Abdominal:     Palpations: Abdomen is soft.     Tenderness: There is no abdominal tenderness.  Musculoskeletal:        General: No swelling.  Cervical back: Neck supple.     Thoracic back: Spasms and tenderness present.       Back:     Comments: Tenderness especially over the specified areas, left worse than right.  Muscle spasm present on the left  Skin:    General: Skin is warm and dry.     Capillary Refill: Capillary refill takes less than 2 seconds.     Comments: Acanthosis nigricans on the lateral neck  Neurological:     Mental Status: He is alert.  Psychiatric:        Mood and Affect: Mood normal.      UC Treatments / Results  Labs (all labs ordered are listed, but only abnormal results are displayed) Labs Reviewed - No data to display  EKG   Radiology No results found.  Procedures Procedures (including critical care time)  Medications Ordered in UC Medications - No data to display  Initial Impression / Assessment and Plan / UC Course  I have reviewed the triage vital signs and the nursing notes.  Pertinent labs & imaging results that were available during my care of the patient were reviewed by me and considered in my medical decision making (see chart for details).     Strain of lumbar region, initial encounter   Lumbar strain from pulling heavy objects at work.  This has been going on for 2 weeks now without much improvement.  We will treat with the following: Decadron injection given today. This is a steroid to help with inflammation and pain. Toradol injection given today. This is a medication to help with pain. This is not a narcotic.  Prednisone 40 mg (2 tablets) once daily for 5 days. Take this in the morning.  This is a steroid to help with inflammation and pain. Robaxin 500 mg every 12 hours as needed for muscle spasms.   Use caution as this medication can cause drowsiness but minimally. Light stretching  Heating pad to the area 10-15 minutes. Do not lean against the pad to avoid thermal injury Return to urgent care or PCP if symptoms worsen or fail to resolve.   Recommend discussing color changes to the neck with your primary care doctor as this could indicate insulin sensitivity  Final Clinical Impressions(s) / UC Diagnoses   Final diagnoses:  None   Discharge Instructions   None    ED Prescriptions   None    PDMP not reviewed this encounter.   Landis Martins, [place] 05/07/23 1353

## 2023-05-07 NOTE — Discharge Instructions (Addendum)
 Lumbar strain from pulling heavy objects at work.  This has been going on for 2 weeks now without much improvement.  We will treat with the following: Decadron injection given today. This is a steroid to help with inflammation and pain. Toradol injection given today. This is a medication to help with pain. This is not a narcotic.  Prednisone 40 mg (2 tablets) once daily for 5 days. Take this in the morning.  This is a steroid to help with inflammation and pain. Robaxin 500 mg every 12 hours as needed for muscle spasms.  Use caution as this medication can cause drowsiness but minimally. Light stretching  Heating pad to the area 10-15 minutes. Do not lean against the pad to avoid thermal injury Return to urgent care or PCP if symptoms worsen or fail to resolve.   Recommend discussing color changes to the neck with your primary care doctor as this could indicate insulin sensitivity

## 2023-05-07 NOTE — ED Triage Notes (Signed)
"  About 2 wks ago after doing a lot of bending/pulling at work my back pain is just getting worse and worse". "The pain is just below my shoulder blade on my life upper (mid) part of my back and sometimes on the right". No chest pain. No sob.

## 2023-06-02 ENCOUNTER — Encounter: Payer: Self-pay | Admitting: Family

## 2023-06-02 ENCOUNTER — Other Ambulatory Visit: Payer: Self-pay | Admitting: Family

## 2023-06-02 ENCOUNTER — Ambulatory Visit (INDEPENDENT_AMBULATORY_CARE_PROVIDER_SITE_OTHER): Admitting: Family

## 2023-06-02 VITALS — BP 134/84 | HR 93 | Temp 98.6°F | Resp 16 | Ht 69.0 in | Wt 255.2 lb

## 2023-06-02 DIAGNOSIS — S39012D Strain of muscle, fascia and tendon of lower back, subsequent encounter: Secondary | ICD-10-CM | POA: Diagnosis not present

## 2023-06-02 DIAGNOSIS — J45909 Unspecified asthma, uncomplicated: Secondary | ICD-10-CM

## 2023-06-02 DIAGNOSIS — E1165 Type 2 diabetes mellitus with hyperglycemia: Secondary | ICD-10-CM

## 2023-06-02 DIAGNOSIS — K429 Umbilical hernia without obstruction or gangrene: Secondary | ICD-10-CM

## 2023-06-02 DIAGNOSIS — Z87898 Personal history of other specified conditions: Secondary | ICD-10-CM | POA: Diagnosis not present

## 2023-06-02 DIAGNOSIS — Z131 Encounter for screening for diabetes mellitus: Secondary | ICD-10-CM

## 2023-06-02 DIAGNOSIS — Z833 Family history of diabetes mellitus: Secondary | ICD-10-CM

## 2023-06-02 LAB — POCT GLYCOSYLATED HEMOGLOBIN (HGB A1C): Hemoglobin A1C: 6.5 % — AB (ref 4.0–5.6)

## 2023-06-02 MED ORDER — ALBUTEROL SULFATE HFA 108 (90 BASE) MCG/ACT IN AERS
1.0000 | INHALATION_SPRAY | Freq: Four times a day (QID) | RESPIRATORY_TRACT | 2 refills | Status: AC | PRN
Start: 2023-06-02 — End: ?

## 2023-06-02 MED ORDER — METFORMIN HCL ER 500 MG PO TB24
500.0000 mg | ORAL_TABLET | Freq: Every day | ORAL | 0 refills | Status: AC
Start: 2023-06-02 — End: ?

## 2023-06-02 MED ORDER — METHOCARBAMOL 500 MG PO TABS
500.0000 mg | ORAL_TABLET | Freq: Two times a day (BID) | ORAL | 1 refills | Status: AC
Start: 2023-06-02 — End: ?

## 2023-06-02 NOTE — Progress Notes (Signed)
 Follow up urgent care for muscle spasms, referral for hernia, get check for diabetes

## 2023-06-02 NOTE — Progress Notes (Signed)
 Patient ID: Jared Chung, male    DOB: 1980-03-27  MRN: [id number]  CC: Urgent Care Follow-Up  Subjective: Jared Chung is a 43 y.o. male who presents for Urgent Care follow-up.   His concerns today include:  05/07/2023 [facility]) per PA note:  Initial Impression / Assessment and Plan / UC Course  I have reviewed the triage vital signs and the nursing notes.   Pertinent labs & imaging results that were available during my care of the patient were reviewed by me and considered in my medical decision making (see chart for details).     Strain of lumbar region, initial encounter    Lumbar strain from pulling heavy objects at work.  This has been going on for 2 weeks now without much improvement.  We will treat with the following: Decadron  injection given today. This is a steroid to help with inflammation and pain. Toradol  injection given today. This is a medication to help with pain. This is not a narcotic.  Prednisone  40 mg (2 tablets) once daily for 5 days. Take this in the morning.  This is a steroid to help with inflammation and pain. Robaxin  500 mg every 12 hours as needed for muscle spasms.  Use caution as this medication can cause drowsiness but minimally. Light stretching  Heating pad to the area 10-15 minutes. Do not lean against the pad to avoid thermal injury Return to urgent care or PCP if symptoms worsen or fail to resolve.   Recommend discussing color changes to the neck with your primary care doctor as this could indicate insulin sensitivity  Today's office visit 06/02/2023: - States feeling improved since recent Urgent Care visit. Denies red flag symptoms. Doing well on medication regimen, no issues/concerns. Needs refills of Methocarbamol . - Doing well on Albuterol  inhaler, no issues/concerns.  - Umbilical hernia. Denies red flag symptoms. States in the past he was referred to specialist and needs new referral. -  Diabetes screening. History of prediabetes. Family history of diabetes.   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 90 tablet 3   Cholecalciferol 125 MCG (5000 UT) TABS Take 1 tablet every day by oral route as directed for 110 days.     dexlansoprazole (DEXILANT) 60 MG capsule Take 1 capsule by mouth daily.     esomeprazole (NEXIUM) 40 MG capsule Take 1 capsule every day by oral route for 30 days.     ezetimibe (ZETIA) 10 MG tablet Take 1 tablet every day by oral route.     flunisolide (NASALIDE) 25 MCG/ACT (0.025%) SOLN      fluticasone (FLONASE) 50 MCG/ACT nasal spray fluticasone propionate 50 mcg/actuation nasal spray,suspension  Spray 1 spray every day by intranasal route for 90 days.     lidocaine (XYLOCAINE) 1 % (with preservative) injection by Infiltration route.     loratadine (CLARITIN) 10 MG tablet loratadine 10 mg tablet  TAKE 1 TABLET BY MOUTH EVERY DAY     montelukast  (SINGULAIR ) 10 MG tablet Take 1 tablet (10 mg total)  by mouth at bedtime. 90 tablet 3   omeprazole  (PRILOSEC) 20 MG capsule Take 1 capsule (20 mg total) by mouth daily. 30 capsule 3   ondansetron  (ZOFRAN ) 8 MG tablet TAKE 1 TABLET(S) EVERY 8 HOURS BY ORAL ROUTE FOR 2 DAYS.     triamcinolone acetonide (KENALOG-40) 40 MG/ML injection Inject into the articular space.     trimethoprim -polymyxin b  (POLYTRIM ) ophthalmic solution      No current facility-administered medications on file prior to visit.    Allergies  Allergen Reactions   Penicillins Rash and Other (See  Comments)    Rash in his mouth Has patient had a PCN reaction causing immediate rash, facial/tongue/throat swelling, SOB or lightheadedness with hypotension: Yes  Has patient had a PCN reaction causing severe rash involving mucus membranes or skin necrosis: No  Has patient had a PCN reaction that required hospitalization No  Has patient had a PCN reaction occurring within the last 10 years: No  If all of the above answers are "NO", then may proceed with Cephalosporin use.    Social History   Socioeconomic History   Marital status: Single    Spouse name: Not on file   Number of children: Not on file   Years of education: Not on file   Highest education level: Not on file  Occupational History   Not on file  Tobacco Use   Smoking status: Never   Smokeless tobacco: Never  Vaping Use   Vaping status: Never Used  Substance and Sexual Activity   Alcohol use: Yes    Comment: occasionally   Drug use: No   Sexual activity: Not on file  Other Topics Concern   Not on file  Social History Narrative   Not on file   Social Drivers of Health   Financial Resource Strain: Low Risk  (04/22/2022)   Overall Financial Resource Strain (CARDIA)    Difficulty of Paying Living Expenses: Not hard at all  Food Insecurity: No Food Insecurity (04/22/2022)   Hunger Vital Sign    Worried About Running Out of Food in the Last Year: Never true    Ran Out of Food in the Last Year: Never true  Transportation Needs: No Transportation Needs (04/22/2022)   PRAPARE - Administrator, Civil Service (Medical): No    Lack of Transportation (Non-Medical): No  Physical Activity: Sufficiently Active (04/22/2022)   Exercise Vital Sign    Days of Exercise per Week: 7 days    Minutes of Exercise per Session: 30 min  Stress: No Stress Concern Present (10/25/2022)   [employer] of Occupational Health - Occupational Stress Questionnaire    Feeling of Stress : Only a little  Social Connections:  Moderately Integrated (04/22/2022)   Social Connection and Isolation Panel [NHANES]    Frequency of Communication with Friends and Family: More than three times a week    Frequency of Social Gatherings with Friends and Family: More than three times a week    Attends Religious Services: More than 4 times per year    Active Member of [employer] or Organizations: Yes    Attends Banker Meetings: More than 4 times per year    Marital Status: Separated  Intimate Partner Violence: Not At Risk (04/22/2022)   Humiliation, Afraid, Rape, and Kick questionnaire    Fear of Current or Ex-Partner: No    Emotionally Abused: No    Physically Abused: No    Sexually Abused: No    History reviewed.  No pertinent family history.  Past Surgical History:  Procedure Laterality Date   MANDIBLE SURGERY      ROS: Review of Systems Negative except as stated above  PHYSICAL EXAM: BP 134/84   Pulse 93   Temp 98.6 F (37 C) (Oral)   Resp 16   Ht 5\' 9"  (1.753 m)   Wt 255 lb 3.2 oz (115.8 kg)   SpO2 95%   BMI 37.69 kg/m   Physical Exam HENT:     Head: Normocephalic and atraumatic.     Nose: Nose normal.     Mouth/Throat:     Mouth: Mucous membranes are moist.     Pharynx: Oropharynx is clear.  Eyes:     Extraocular Movements: Extraocular movements intact.     Conjunctiva/sclera: Conjunctivae normal.     Pupils: Pupils are equal, round, and reactive to light.  Cardiovascular:     Rate and Rhythm: Normal rate and regular rhythm.     Pulses: Normal pulses.     Heart sounds: Normal heart sounds.  Pulmonary:     Effort: Pulmonary effort is normal.     Breath sounds: Normal breath sounds.  Abdominal:     General: Bowel sounds are normal.     Palpations: Abdomen is soft.     Hernia: A hernia is present.  Musculoskeletal:        General: Normal range of motion.     Right shoulder: Normal.     Left shoulder: Normal.     Right upper arm: Normal.     Left upper arm: Normal.     Right  elbow: Normal.     Left elbow: Normal.     Right forearm: Normal.     Left forearm: Normal.     Right wrist: Normal.     Left wrist: Normal.     Right hand: Normal.     Left hand: Normal.     Cervical back: Normal, normal range of motion and neck supple.     Thoracic back: Normal.     Lumbar back: Normal.     Right hip: Normal.     Left hip: Normal.     Right upper leg: Normal.     Left upper leg: Normal.     Right knee: Normal.     Left knee: Normal.     Right lower leg: Normal.     Left lower leg: Normal.     Right ankle: Normal.     Left ankle: Normal.     Right foot: Normal.     Left foot: Normal.  Neurological:     General: No focal deficit present.     Mental Status: He is alert and oriented to person, place, and time.  Psychiatric:        Mood and Affect: Mood normal.        Behavior: Behavior normal.    ASSESSMENT AND PLAN: 1. Strain of lumbar region, subsequent encounter (Primary) - Continue Methocarbamol  as prescribed. Counseled on medication adherence/adverse effects.  - Patient declined referral to Orthopedics.  - Follow-up with primary provider in 4 weeks or sooner if needed. - methocarbamol  (ROBAXIN ) 500 MG tablet; Take 1 tablet (500 mg total) by mouth 2 (two) times daily.  Dispense: 30 tablet; Refill: 1  2. Asthma, unspecified asthma severity, unspecified whether complicated, unspecified whether persistent - Continue Albuterol  inhaler as prescribed. Counseled on medication adherence/adverse effects.  - Follow-up with primary provider as scheduled. - albuterol  (VENTOLIN  HFA) 108 (90  Base) MCG/ACT inhaler; Inhale 1-2 puffs into the lungs every 6 (six) hours as needed for wheezing.  Dispense: 18 g; Refill: 2  3. Umbilical hernia without obstruction and without gangrene - Referral to General Surgery for evaluation/management. - Ambulatory referral to General Surgery  4. Diabetes mellitus screening 5. History of prediabetes 6. Family history of diabetes  mellitus - Routine screening.  - POCT glycosylated hemoglobin (Hb A1C)    Patient was given the opportunity to ask questions.  Patient verbalized understanding of the plan and was able to repeat key elements of the plan. Patient was given clear instructions to go to Emergency Department or return to medical center if symptoms don't improve, worsen, or new problems develop.The patient verbalized understanding.   Orders Placed This Encounter  Procedures   Ambulatory referral to General Surgery   POCT glycosylated hemoglobin (Hb A1C)     Requested Prescriptions   Signed Prescriptions Disp Refills   albuterol  (VENTOLIN  HFA) 108 (90 Base) MCG/ACT inhaler 18 g 2    Sig: Inhale 1-2 puffs into the lungs every 6 (six) hours as needed for wheezing.   methocarbamol  (ROBAXIN ) 500 MG tablet 30 tablet 1    Sig: Take 1 tablet (500 mg total) by mouth 2 (two) times daily.    Return for Follow-Up or next available with Abraham Abo, MD.  Senaida Dama, NP

## 2023-11-09 ENCOUNTER — Other Ambulatory Visit: Payer: Self-pay | Admitting: Family Medicine

## 2023-11-10 NOTE — Telephone Encounter (Signed)
 Pt scheduled

## 2023-12-13 ENCOUNTER — Ambulatory Visit: Admitting: Family Medicine

## 2024-02-21 ENCOUNTER — Ambulatory Visit: Admission: EM | Admit: 2024-02-21 | Discharge: 2024-02-21 | Disposition: A | Discharge status: Home / Self Care

## 2024-02-21 ENCOUNTER — Encounter: Payer: Self-pay | Admitting: Emergency Medicine

## 2024-02-21 DIAGNOSIS — J45909 Unspecified asthma, uncomplicated: Secondary | ICD-10-CM | POA: Diagnosis not present

## 2024-02-21 DIAGNOSIS — Z880 Allergy status to penicillin: Secondary | ICD-10-CM | POA: Insufficient documentation

## 2024-02-21 DIAGNOSIS — Z760 Encounter for issue of repeat prescription: Secondary | ICD-10-CM | POA: Insufficient documentation

## 2024-02-21 DIAGNOSIS — Z79899 Other long term (current) drug therapy: Secondary | ICD-10-CM | POA: Insufficient documentation

## 2024-02-21 DIAGNOSIS — B9789 Other viral agents as the cause of diseases classified elsewhere: Secondary | ICD-10-CM | POA: Insufficient documentation

## 2024-02-21 DIAGNOSIS — R059 Cough, unspecified: Secondary | ICD-10-CM | POA: Diagnosis not present

## 2024-02-21 DIAGNOSIS — J069 Acute upper respiratory infection, unspecified: Secondary | ICD-10-CM | POA: Insufficient documentation

## 2024-02-21 LAB — POCT RAPID STREP A (OFFICE): Rapid Strep A Screen: NEGATIVE

## 2024-02-21 LAB — POC SOFIA SARS ANTIGEN FIA: SARS Coronavirus 2 Ag: NEGATIVE

## 2024-02-21 MED ORDER — MONTELUKAST SODIUM 10 MG PO TABS: 10.0000 mg | ORAL_TABLET | Freq: Every day | ORAL | 2 refills | Status: AC

## 2024-02-21 MED ORDER — ONDANSETRON 4 MG PO TBDP
4.0000 mg | ORAL_TABLET | Freq: Once | ORAL | Status: AC
  Administered 2024-02-21: 4 mg via ORAL

## 2024-02-21 NOTE — Discharge Instructions (Addendum)
 You have a viral illness which will improve on its own with rest, fluids, and medications to help with your symptoms.  COVID-19 and strep testing are both negative today.  Tylenol , guaifenesin (plain mucinex), and saline nasal sprays may help relieve symptoms.   Two teaspoons of honey in 1 cup of warm water every 4-6 hours may help with throat pains.  Humidifier in room at nighttime may help soothe cough (clean well daily).   Please continue to take lots of water to stay well-hydrated while you are having diarrhea.  For chest pain, shortness of breath, inability to keep food or fluids down without vomiting, fever that does not respond to tylenol  or motrin , or any other severe symptoms, please go to the ER for further evaluation. Return to urgent care as needed, otherwise follow-up with PCP.

## 2024-02-21 NOTE — ED Provider Notes (Signed)
 " [facility]    CSN: [id number] Arrival date & time: 02/21/24  1206      History   Chief Complaint Chief Complaint  Patient presents with   Emesis   Fever   Headache   Diarrhea   Cough    HPI Jared Chung is a 44 y.o. male.   Jared Chung is a 44 y.o. male presenting for chief complaint of Emesis, Fever, Headache, Diarrhea, and Cough that started approximately 5 days ago.  Cough is minimally productive and is worse at nighttime.  His 35-year-old twin daughters are sick with similar symptoms.  He has had chills without documented fever at home.  Multiple episodes of diarrhea in the last 24 hours.  He has not had any episodes of emesis in the last 2 to 3 days.  States his abdomen feels queasy and he has had poor oral intake.  History of asthma, he has not needed his albuterol  inhaler during this illness but does report shortness of breath associated with coughing.  He additionally requests refill of his Singulair  as he is not able to get in with his primary care provider until several months.  Taking over-the-counter medications wit some relief of symptoms.    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   Radial styloid tenosynovitis (de quervain) 04/07/2023   Asthma 02/10/2021   Gastroesophageal reflux disease 02/10/2021   Elevated blood-pressure reading without diagnosis of hypertension 01/11/2021   Long term current use of therapeutic drug 01/11/2021   Obstructive sleep apnea syndrome 01/11/2021   Strain of unspecified muscle, fascia and tendon  at wrist and hand level, left hand, initial encounter 01/11/2021   Encounter for general adult medical examination without abnormal findings 09/15/2019   Hyperlipidemia 09/15/2019   Vitamin D deficiency 09/15/2019   Encounter for screening for other metabolic disorders 09/08/2019   Conjunctival hemorrhage of left eye 09/01/2019   Exposure to severe acute respiratory syndrome coronavirus 2 (SARS-CoV-2) 03/17/2019   Left lower quadrant abdominal tenderness 07/24/2018   Left lower quadrant pain 07/24/2018   Hypersomnia 12/22/2016   Umbilical hernia 11/16/2014   Body mass index (BMI) 39.0-39.9, adult 11/16/2014   Nausea 02/27/2014    Past Surgical History:  Procedure Laterality Date   MANDIBLE SURGERY         Home Medications    Prior to Admission medications  Medication Sig Start Date End Date Taking? Authorizing Provider  montelukast  (SINGULAIR ) 10 MG tablet Take 1 tablet (10 mg total) by mouth at bedtime. 02/21/24  Yes Enedelia Dorna HERO, FNP  predniSONE  (DELTASONE ) 20 MG tablet Oral 01/11/21  Yes [provider]  albuterol  (VENTOLIN  HFA) 108 (90 Base) MCG/ACT inhaler Inhale 1-2 puffs into the lungs every 6 (six) hours as needed for wheezing. 06/02/23   Jaycee Greig PARAS, NP  cetirizine  (ZYRTEC ) 10 MG tablet Take 1 tablet (10 mg total) by mouth daily. 10/25/22   Tanda Bleacher, MD  Cholecalciferol 125 MCG (5000 UT) TABS Take 1 tablet every day by oral route as directed for 110 days. 09/15/19   [provider]  dexlansoprazole (  DEXILANT) 60 MG capsule Take 1 capsule by mouth daily.    [provider]  esomeprazole (NEXIUM) 40 MG capsule Take 1 capsule every day by oral route for 30 days. 12/22/16   [provider]  ezetimibe (ZETIA) 10 MG tablet Take 1 tablet every day by oral route. 11/22/14   [provider]  flunisolide (NASALIDE) 25 MCG/ACT (0.025%) SOLN  12/24/21   [provider]  fluticasone (FLONASE) 50 MCG/ACT nasal spray  fluticasone propionate 50 mcg/actuation nasal spray,suspension  Spray 1 spray every day by intranasal route for 90 days.    [provider]  lidocaine (XYLOCAINE) 1 % (with preservative) injection by Infiltration route. 12/24/21   [provider]  loratadine (CLARITIN) 10 MG tablet loratadine 10 mg tablet  TAKE 1 TABLET BY MOUTH EVERY DAY    [provider]  metFORMIN  (GLUCOPHAGE -XR) 500 MG 24 hr tablet Take 1 tablet (500 mg total) by mouth daily with breakfast. 06/02/23   Jaycee Greig PARAS, NP  methocarbamol  (ROBAXIN ) 500 MG tablet Take 1 tablet (500 mg total) by mouth 2 (two) times daily. 06/02/23   Jaycee Greig PARAS, NP  omeprazole  (PRILOSEC) 20 MG capsule Take 1 capsule (20 mg total) by mouth daily. 07/09/21   Tanda Bleacher, MD  ondansetron  (ZOFRAN ) 8 MG tablet TAKE 1 TABLET(S) EVERY 8 HOURS BY ORAL ROUTE FOR 2 DAYS.    [provider]  triamcinolone acetonide (KENALOG-40) 40 MG/ML injection Inject into the articular space. 12/24/21   [provider]  trimethoprim -polymyxin b  (POLYTRIM ) ophthalmic solution     [provider]    Family History History reviewed. No pertinent family history.  Social History Social History[1]   Allergies   Penicillins   Review of Systems Review of Systems  Constitutional:  Positive for fever.  Respiratory:  Positive for cough.   Gastrointestinal:  Positive for diarrhea and vomiting.  Neurological:  Positive for headaches.  Per HPI   Physical Exam Triage Vital Signs ED Triage Vitals  Encounter Vitals Group     BP 02/21/24 1408 136/88     Girls Systolic BP Percentile --      Girls Diastolic BP Percentile --      Boys Systolic BP Percentile --      Boys Diastolic BP Percentile --      Pulse Rate 02/21/24 1408 70     Resp 02/21/24 1408 18     Temp 02/21/24 1408 98.6 F (37 C)     Temp Source 02/21/24 1408 Oral     SpO2 02/21/24 1408 97 %     Weight 02/21/24 1407 235 lb (106.6 kg)     Height --       Head Circumference --      Peak Flow --      Pain Score 02/21/24 1407 0     Pain Loc --      Pain Education --      Exclude from Growth Chart --    No data found.  Updated Vital Signs BP 136/88 (BP Location: Left Arm)   Pulse 70   Temp 98.6 F (37 C) (Oral)   Resp 18   Wt 235 lb (106.6 kg)   SpO2 97%   BMI 34.70 kg/m   Visual Acuity Right Eye Distance:   Left Eye Distance:   Bilateral Distance:    Right Eye Near:   Left Eye Near:    Bilateral Near:     Physical Exam Vitals and nursing note  reviewed.  Constitutional:      Appearance: He is not ill-appearing or toxic-appearing.  HENT:     Head: Normocephalic and atraumatic.     Right Ear: Hearing, tympanic membrane, ear canal and external ear normal.     Left Ear: Hearing, tympanic membrane, ear canal and external ear normal.     Nose: Congestion present.     Mouth/Throat:     Lips: Pink.     Mouth: Mucous membranes are moist. No injury or oral lesions.     Dentition: Normal dentition.     Tongue: No lesions.     Pharynx: Uvula midline. Pharyngeal swelling and posterior oropharyngeal erythema present. No oropharyngeal exudate, uvula swelling or postnasal drip.     Tonsils: No tonsillar exudate.     Comments: No trismus, phonation normal, maintaining secretions without difficulty.  Eyes:     General: Lids are normal. Vision grossly intact. Gaze aligned appropriately.     Extraocular Movements: Extraocular movements intact.     Conjunctiva/sclera: Conjunctivae normal.  Neck:     Trachea: Trachea and phonation normal.  Cardiovascular:     Rate and Rhythm: Normal rate and regular rhythm.     Heart sounds: Normal heart sounds, S1 normal and S2 normal.  Pulmonary:     Effort: Pulmonary effort is normal. No respiratory distress.     Breath sounds: Normal breath sounds and air entry. No wheezing, rhonchi or rales.  Chest:     Chest wall: No tenderness.  Abdominal:     General: Bowel sounds are normal.      Palpations: Abdomen is soft.     Tenderness: There is no abdominal tenderness. There is no right CVA tenderness, left CVA tenderness or guarding.  Musculoskeletal:     Cervical back: Neck supple.  Lymphadenopathy:     Cervical: No cervical adenopathy.  Skin:    General: Skin is warm and dry.     Capillary Refill: Capillary refill takes less than 2 seconds.     Findings: No rash.  Neurological:     General: No focal deficit present.     Mental Status: He is alert and oriented to person, place, and time. Mental status is at baseline.     Cranial Nerves: No dysarthria or facial asymmetry.  Psychiatric:        Mood and Affect: Mood normal.        Speech: Speech normal.        Behavior: Behavior normal.        Thought Content: Thought content normal.        Judgment: Judgment normal.      UC Treatments / Results  Labs (all labs ordered are listed, but only abnormal results are displayed) Labs Reviewed  POC SOFIA SARS ANTIGEN FIA - Normal  POCT RAPID STREP A (OFFICE) - Normal  CULTURE, GROUP A STREP [facility])    EKG   Radiology No results found.  Procedures Procedures (including critical care time)  Medications Ordered in UC Medications  ondansetron  (ZOFRAN -ODT) disintegrating tablet 4 mg (4 mg Oral Given 02/21/24 1452)    Initial Impression / Assessment and Plan / UC Course  I have reviewed the triage vital signs and the nursing notes.  Pertinent labs & imaging results that were available during my care of the patient were reviewed by me and considered in my medical decision making (see chart for details).   1. Viral URI with cough, asthma without acute exacerbation, medication refill Suspect viral URI, viral  syndrome.  Strep/viral testing: POC COVID and strep negative, throat culture pending.   Physical exam findings reassuring, vital signs hemodynamically stable, and lungs clear, therefore deferred imaging of the chest.  Advised supportive care/prescriptions for  symptomatic relief as outlined in AVS.    No current signs of asthma exacerbation. Singulair  is refilled, advised to follow-up with primary care provider as scheduled who will provide further refills of medication.  Counseled patient on potential for adverse effects with medications prescribed/recommended today, strict ER and return-to-clinic precautions discussed, patient verbalized understanding.   Final Clinical Impressions(s) / UC Diagnoses   Final diagnoses:  Viral URI with cough  Asthma without acute exacerbation  Medication refill     Discharge Instructions      You have a viral illness which will improve on its own with rest, fluids, and medications to help with your symptoms.  COVID-19 and strep testing are both negative today.  Tylenol , guaifenesin (plain mucinex), and saline nasal sprays may help relieve symptoms.   Two teaspoons of honey in 1 cup of warm water every 4-6 hours may help with throat pains.  Humidifier in room at nighttime may help soothe cough (clean well daily).   Please continue to take lots of water to stay well-hydrated while you are having diarrhea.  For chest pain, shortness of breath, inability to keep food or fluids down without vomiting, fever that does not respond to tylenol  or motrin , or any other severe symptoms, please go to the ER for further evaluation. Return to urgent care as needed, otherwise follow-up with PCP.       ED Prescriptions     Medication Sig Dispense Auth. Provider   montelukast  (SINGULAIR ) 10 MG tablet Take 1 tablet (10 mg total) by mouth at bedtime. 30 tablet Enedelia Dorna HERO, FNP      PDMP not reviewed this encounter.    [1]  Social History Tobacco Use   Smoking status: Never    Passive exposure: Never   Smokeless tobacco: Never  Vaping Use   Vaping status: Never Used  Substance Use Topics   Alcohol use: Yes    Comment: occasionally   Drug use: No     Enedelia Dorna HERO, FNP 02/21/24  1502  "

## 2024-02-21 NOTE — ED Triage Notes (Signed)
 Pt presents c/o Headache, body aches, emesis, diarrhea, and cough  x 4 days. Pt states,  Sunday started with the throwing up, aching, headache, diarrhea, and sore throat. Monday, the cough started with the other sxs. I keep getting hot flashes and chills.

## 2024-02-24 ENCOUNTER — Ambulatory Visit (HOSPITAL_COMMUNITY): Payer: Self-pay

## 2024-02-24 LAB — CULTURE, GROUP A STREP (THRC)
